# Patient Record
Sex: Female | Born: 1990 | Race: White | Hispanic: No | Marital: Single | Smoking: Former smoker
Health system: Southern US, Community
[De-identification: ages and names within clinical notes are randomized; demographics above are authoritative.]

## PROBLEM LIST (undated history)

## (undated) ENCOUNTER — Inpatient Hospital Stay (HOSPITAL_COMMUNITY): Payer: Self-pay

## (undated) DIAGNOSIS — F419 Anxiety disorder, unspecified: Secondary | ICD-10-CM

## (undated) DIAGNOSIS — O34219 Maternal care for unspecified type scar from previous cesarean delivery: Secondary | ICD-10-CM

## (undated) DIAGNOSIS — R51 Headache: Secondary | ICD-10-CM

## (undated) DIAGNOSIS — G8929 Other chronic pain: Secondary | ICD-10-CM

## (undated) DIAGNOSIS — F32A Depression, unspecified: Secondary | ICD-10-CM

## (undated) DIAGNOSIS — Z87442 Personal history of urinary calculi: Secondary | ICD-10-CM

## (undated) DIAGNOSIS — D649 Anemia, unspecified: Secondary | ICD-10-CM

## (undated) DIAGNOSIS — R519 Headache, unspecified: Secondary | ICD-10-CM

## (undated) DIAGNOSIS — J302 Other seasonal allergic rhinitis: Secondary | ICD-10-CM

## (undated) DIAGNOSIS — F900 Attention-deficit hyperactivity disorder, predominantly inattentive type: Secondary | ICD-10-CM

## (undated) DIAGNOSIS — K219 Gastro-esophageal reflux disease without esophagitis: Secondary | ICD-10-CM

## (undated) DIAGNOSIS — F329 Major depressive disorder, single episode, unspecified: Secondary | ICD-10-CM

## (undated) HISTORY — DX: Attention-deficit hyperactivity disorder, predominantly inattentive type: F90.0

## (undated) HISTORY — DX: Anemia, unspecified: D64.9

## (undated) HISTORY — DX: Major depressive disorder, single episode, unspecified: F32.9

## (undated) HISTORY — DX: Other seasonal allergic rhinitis: J30.2

## (undated) HISTORY — DX: Headache, unspecified: R51.9

## (undated) HISTORY — DX: Other chronic pain: G89.29

## (undated) HISTORY — PX: TONSILLECTOMY: SUR1361

## (undated) HISTORY — DX: Gastro-esophageal reflux disease without esophagitis: K21.9

## (undated) HISTORY — DX: Headache: R51

## (undated) HISTORY — DX: Personal history of urinary calculi: Z87.442

## (undated) HISTORY — DX: Depression, unspecified: F32.A

---
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
## 2008-02-11 ENCOUNTER — Ambulatory Visit: Payer: Self-pay | Admitting: Family Medicine

## 2008-02-11 DIAGNOSIS — H109 Unspecified conjunctivitis: Secondary | ICD-10-CM | POA: Insufficient documentation

## 2008-05-01 ENCOUNTER — Telehealth: Payer: Self-pay | Admitting: Family Medicine

## 2008-07-21 ENCOUNTER — Ambulatory Visit: Payer: Self-pay | Admitting: Family Medicine

## 2008-07-21 DIAGNOSIS — J1189 Influenza due to unidentified influenza virus with other manifestations: Secondary | ICD-10-CM | POA: Insufficient documentation

## 2008-10-23 ENCOUNTER — Ambulatory Visit (HOSPITAL_COMMUNITY): Admission: RE | Admit: 2008-10-23 | Discharge: 2008-10-23 | Payer: Self-pay | Admitting: Obstetrics and Gynecology

## 2009-01-21 ENCOUNTER — Observation Stay (HOSPITAL_COMMUNITY): Admission: AD | Admit: 2009-01-21 | Discharge: 2009-01-22 | Payer: Self-pay | Admitting: Obstetrics and Gynecology

## 2009-02-03 ENCOUNTER — Inpatient Hospital Stay (HOSPITAL_COMMUNITY): Admission: AD | Admit: 2009-02-03 | Discharge: 2009-02-03 | Payer: Self-pay | Admitting: Obstetrics and Gynecology

## 2009-02-09 ENCOUNTER — Inpatient Hospital Stay (HOSPITAL_COMMUNITY): Admission: AD | Admit: 2009-02-09 | Discharge: 2009-02-12 | Payer: Self-pay | Admitting: Obstetrics and Gynecology

## 2009-02-15 ENCOUNTER — Inpatient Hospital Stay (HOSPITAL_COMMUNITY): Admission: AD | Admit: 2009-02-15 | Discharge: 2009-02-19 | Payer: Self-pay | Admitting: Obstetrics and Gynecology

## 2009-02-15 ENCOUNTER — Encounter: Payer: Self-pay | Admitting: Obstetrics and Gynecology

## 2009-02-20 ENCOUNTER — Inpatient Hospital Stay (HOSPITAL_COMMUNITY): Admission: AD | Admit: 2009-02-20 | Discharge: 2009-02-21 | Payer: Self-pay | Admitting: Obstetrics and Gynecology

## 2009-05-29 ENCOUNTER — Emergency Department (HOSPITAL_COMMUNITY): Admission: EM | Admit: 2009-05-29 | Discharge: 2009-05-29 | Payer: Self-pay | Admitting: Emergency Medicine

## 2009-09-08 ENCOUNTER — Emergency Department (HOSPITAL_COMMUNITY): Admission: EM | Admit: 2009-09-08 | Discharge: 2009-09-08 | Payer: Self-pay | Admitting: Emergency Medicine

## 2010-08-06 ENCOUNTER — Emergency Department (HOSPITAL_COMMUNITY): Admission: EM | Admit: 2010-08-06 | Discharge: 2010-08-06 | Payer: Self-pay | Admitting: Family Medicine

## 2010-08-06 LAB — ABO/RH: RH Type: POSITIVE

## 2010-08-06 LAB — RPR
RPR: BORDERLINE
RPR: NONREACTIVE

## 2010-09-12 HISTORY — DX: Maternal care for unspecified type scar from previous cesarean delivery: O34.219

## 2010-09-12 NOTE — L&D Delivery Note (Signed)
Delivery Note At 9:45 PM a viable and healthy female was delivered via Vaginal, Spontaneous Delivery (Presentation: Left Occiput Anterior).  APGAR: 9, 9; weight 7 lb 1.1 oz (3205 g).   Placenta status: Intact, Spontaneous.  Cord: 3 vessels with the following complications: None.   Anesthesia: Epidural  Episiotomy: None Lacerations: Bilateral labial lacerations noted repaired with both 3-0 chromic and 3-0 vicryl with locking and interrupted stitches.  Hemostasis assured.  No midline lacerations.   Suture Repair: 3.0 chromic Est. Blood Loss (mL): 350  Mom to postpartum.  Baby to nursery-stable.  Malcolm Metro 04/01/2011, 10:16 PM

## 2010-10-04 ENCOUNTER — Encounter: Payer: Self-pay | Admitting: Obstetrics and Gynecology

## 2010-11-05 ENCOUNTER — Inpatient Hospital Stay (HOSPITAL_COMMUNITY)
Admission: AD | Admit: 2010-11-05 | Discharge: 2010-11-06 | Disposition: A | Discharge status: Home / Self Care | Payer: Medicaid Other | Source: Ambulatory Visit | Attending: Obstetrics and Gynecology | Admitting: Obstetrics and Gynecology

## 2010-11-05 DIAGNOSIS — O210 Mild hyperemesis gravidarum: Secondary | ICD-10-CM | POA: Insufficient documentation

## 2010-11-05 LAB — URINALYSIS, ROUTINE W REFLEX MICROSCOPIC
Hgb urine dipstick: NEGATIVE
Nitrite: NEGATIVE
Specific Gravity, Urine: 1.015 (ref 1.005–1.030)
Urobilinogen, UA: 0.2 mg/dL (ref 0.0–1.0)

## 2010-11-05 LAB — BASIC METABOLIC PANEL
CO2: 24 mEq/L (ref 19–32)
Calcium: 9 mg/dL (ref 8.4–10.5)
GFR calc Af Amer: >60 mL/min (ref >60)
GFR calc non Af Amer: >60 mL/min (ref >60)
Sodium: 136 mEq/L (ref 135–145)

## 2010-11-23 LAB — POCT PREGNANCY, URINE: Preg Test, Ur: POSITIVE

## 2010-12-20 LAB — URINALYSIS, ROUTINE W REFLEX MICROSCOPIC
Ketones, ur: 40 mg/dL — AB
Nitrite: NEGATIVE
Protein, ur: NEGATIVE mg/dL
Specific Gravity, Urine: 1.015 (ref 1.005–1.030)
Urobilinogen, UA: 2 mg/dL — ABNORMAL HIGH (ref 0.0–1.0)

## 2010-12-20 LAB — DIFFERENTIAL
Basophils Absolute: 0 10*3/uL (ref 0.0–0.1)
Basophils Absolute: 0 10*3/uL (ref 0.0–0.1)
Basophils Relative: 0 % (ref 0–1)
Basophils Relative: 0 % (ref 0–1)
Eosinophils Absolute: 0 10*3/uL (ref 0.0–0.7)
Eosinophils Absolute: 0.1 10*3/uL (ref 0.0–0.7)
Eosinophils Relative: 0 % (ref 0–5)
Lymphocytes Relative: 12 % (ref 12–46)
Lymphs Abs: 1.4 10*3/uL (ref 0.7–4.0)
Monocytes Absolute: 0.5 10*3/uL (ref 0.1–1.0)
Monocytes Absolute: 0.9 10*3/uL (ref 0.1–1.0)
Monocytes Relative: 5 % (ref 3–12)
Monocytes Relative: 7 % (ref 3–12)
Neutro Abs: 10.7 10*3/uL — ABNORMAL HIGH (ref 1.7–7.7)
Neutro Abs: 9.3 10*3/uL — ABNORMAL HIGH (ref 1.7–7.7)
Neutrophils Relative %: 83 % — ABNORMAL HIGH (ref 43–77)

## 2010-12-20 LAB — CBC
HCT: 30.4 % — ABNORMAL LOW (ref 36.0–46.0)
HCT: 31.7 % — ABNORMAL LOW (ref 36.0–46.0)
HCT: 35.6 % — ABNORMAL LOW (ref 36.0–46.0)
Hemoglobin: 10.9 g/dL — ABNORMAL LOW (ref 12.0–15.0)
Hemoglobin: 11.5 g/dL — ABNORMAL LOW (ref 12.0–15.0)
MCHC: 34.6 g/dL (ref 30.0–36.0)
MCHC: 36 g/dL (ref 30.0–36.0)
MCHC: 36.4 g/dL — ABNORMAL HIGH (ref 30.0–36.0)
MCV: 94.5 fL (ref 78.0–100.0)
MCV: 94.8 fL (ref 78.0–100.0)
MCV: 94.9 fL (ref 78.0–100.0)
Platelets: 269 10*3/uL (ref 150–400)
RBC: 3.2 MIL/uL — ABNORMAL LOW (ref 3.87–5.11)
RBC: 3.35 MIL/uL — ABNORMAL LOW (ref 3.87–5.11)
RBC: 3.75 MIL/uL — ABNORMAL LOW (ref 3.87–5.11)
RDW: 11.9 % (ref 11.5–15.5)
RDW: 12.1 % (ref 11.5–15.5)
RDW: 12.6 % (ref 11.5–15.5)
WBC: 11.3 10*3/uL — ABNORMAL HIGH (ref 4.0–10.5)
WBC: 13.8 10*3/uL — ABNORMAL HIGH (ref 4.0–10.5)

## 2010-12-20 LAB — URINE CULTURE
Colony Count: NO GROWTH
Culture: NO GROWTH

## 2010-12-20 LAB — URINE MICROSCOPIC-ADD ON

## 2010-12-21 LAB — URINALYSIS, ROUTINE W REFLEX MICROSCOPIC
Bilirubin Urine: NEGATIVE
Glucose, UA: NEGATIVE mg/dL
Ketones, ur: NEGATIVE mg/dL
Ketones, ur: 15 mg/dL — AB
Leukocytes, UA: NEGATIVE
Nitrite: NEGATIVE
Nitrite: NEGATIVE
Protein, ur: NEGATIVE mg/dL
Urobilinogen, UA: 0.2 mg/dL (ref 0.0–1.0)
pH: 6 (ref 5.0–8.0)
pH: 6.5 (ref 5.0–8.0)

## 2010-12-21 LAB — URINE CULTURE

## 2010-12-21 LAB — URINALYSIS, MICROSCOPIC ONLY
Bilirubin Urine: NEGATIVE
Glucose, UA: NEGATIVE mg/dL
Hgb urine dipstick: NEGATIVE
Specific Gravity, Urine: <1.005 — ABNORMAL LOW (ref 1.005–1.030)
pH: 7 (ref 5.0–8.0)

## 2010-12-26 ENCOUNTER — Inpatient Hospital Stay (HOSPITAL_COMMUNITY)
Admission: AD | Admit: 2010-12-26 | Discharge: 2010-12-26 | Disposition: A | Discharge status: Home / Self Care | Payer: Medicaid Other | Source: Ambulatory Visit | Attending: Obstetrics and Gynecology | Admitting: Obstetrics and Gynecology

## 2010-12-26 DIAGNOSIS — O9989 Other specified diseases and conditions complicating pregnancy, childbirth and the puerperium: Secondary | ICD-10-CM

## 2010-12-26 DIAGNOSIS — O212 Late vomiting of pregnancy: Secondary | ICD-10-CM | POA: Insufficient documentation

## 2010-12-26 DIAGNOSIS — O99891 Other specified diseases and conditions complicating pregnancy: Secondary | ICD-10-CM

## 2010-12-26 DIAGNOSIS — B9789 Other viral agents as the cause of diseases classified elsewhere: Secondary | ICD-10-CM | POA: Insufficient documentation

## 2010-12-26 LAB — DIFFERENTIAL
Eosinophils Relative: 0 % (ref 0–5)
Lymphocytes Relative: 7 % — ABNORMAL LOW (ref 12–46)
Lymphs Abs: 1 10*3/uL (ref 0.7–4.0)
Monocytes Relative: 5 % (ref 3–12)

## 2010-12-26 LAB — CBC
HCT: 36.7 % (ref 36.0–46.0)
MCH: 30.8 pg (ref 26.0–34.0)
MCV: 92 fL (ref 78.0–100.0)
RBC: 3.99 MIL/uL (ref 3.87–5.11)
RDW: 12.7 % (ref 11.5–15.5)
WBC: 13.9 10*3/uL — ABNORMAL HIGH (ref 4.0–10.5)

## 2010-12-26 LAB — URINALYSIS, ROUTINE W REFLEX MICROSCOPIC
Bilirubin Urine: NEGATIVE
Ketones, ur: 15 mg/dL — AB
Nitrite: NEGATIVE
Protein, ur: NEGATIVE mg/dL
pH: 6 (ref 5.0–8.0)

## 2010-12-26 LAB — URINE MICROSCOPIC-ADD ON

## 2010-12-28 LAB — URINE CULTURE: Culture  Setup Time: 201204160000

## 2011-01-25 NOTE — Discharge Summary (Signed)
NAMEJAIA, ALONGE                  ACCOUNT NO.:  [id number]   MEDICAL RECORD NO.:  [id number]         PATIENT TYPE:  WINP   LOCATION:                                FACILITY[facility]  [facility]   PHYSICIAN:  Arlyce Harman, MD     DATE OF BIRTH:  23-Jan-1991   DATE OF ADMISSION:  02/09/2009  DATE OF DISCHARGE:                               DISCHARGE SUMMARY   Ms. Mogg was admitted at 33-1/[redacted] weeks gestation on Feb 09, 2009, for  observation due to complaints of contractions and possible preterm  labor.  She was given Iv hydration and oral tocolytics. Howeverd she  continued to have regular contractions and was admitted on February 10, 2009.  Treatment in the hospital has been IV hydration plus p.o. terbutaline.  She did eventually respond and is currently denying contractions or  leakage of fluids.  The patient is currently 34 weeks and will be  discharged to home on bedrest.   LABORATORY DATA:  WBC is 11,300 and hematocrit is 30.4.   FINAL DIAGNOSIS:  Preterm labor.   SIGNIFICANT FINDINGS:  Labor, resolved.  No cervical change.   Discharge medications:  Prenatal vitamins one daily p. o.    Terbutaline 5 mg 1 p.o. every 6 hours    Ferrolet 90 1 p. o. daily    Colace 50 mg 1 p. o. daily     Procedures performed and treatment rendered is bedrest and IV hydration  and oral tocolytics.   Condition of the patient on discharge is excellent with no further  complaints of contractions.   Instructions given to the patient and family are home bedrest with  bathroom privileges.  No sexual activity.   Diet is regular.   Followup care with Dr. Neva Seat in the office on Monday.      Arlyce Harman, MD  Electronically Signed     EG/MEDQ  D:  02/12/2009  T:  02/13/2009  Job:  [id number]

## 2011-01-25 NOTE — Discharge Summary (Signed)
[name], [name]                  ACCOUNT NO.:  [id number]   MEDICAL RECORD NO.:  [id number]          PATIENT TYPE:  INP   LOCATION:  9320                          [facility]ACILITY:  [facility]   PHYSICIAN:  Arlyce Harman, MD     DATE OF BIRTH:  06-26-1991   DATE OF ADMISSION:  02/15/2009  DATE OF DISCHARGE:  02/19/2009                               DISCHARGE SUMMARY   ADMITTING DIAGNOSIS:  34-1/2-weeks pregnant with prematurely ruptured  membranes and contractions, breech presentation.   REASON FOR HOSPITALIZATION:  The patient is a 20 year old primigravida  who is 34-1/2-weeks pregnant.  She has had multiple admissions for  premature labor and was known to be in breech presentation when she was  discharged a week prior to a admission.  The patient was in her usual  state of health until approximately 30 minutes prior to admission when  she complained of a gush of fluid which she could not stop.  Therefore,  she came to the hospital for evaluation.  Membranes were confirmed to be  ruptured.  [name] was having irregular contractions.  An ultrasound  revealed that the patient was still in a frank breech position.  Because  of prematurity. prematurely ruptured membranes, and breech presentation,  Cesarean section was done.  The infant was a viable female infant with  good Apgars and was taken to the NICU.  Postoperative course for [name]  has been unremarkable.  [name] was discharged on the fourth postop day in  good condition.  She will be followed up in my office in 2 weeks for an  incision check.   DISCHARGE MEDICATIONS:  Percocet 5 mg, 1-2 every 4-6 hours as needed for  pain  Prenatal vitamins 1 a day  Ferralet 90 one a day  Colace 1 tablet a day as needed for constipation.   LABORATORY DATA:  Hemoglobin of 11.5 and hematocrit of 31.7 on February 16, 2009.  Other labs unremarkable.   FINAL DIAGNOSIS:  A 34-1/2-weeks pregnancy with premature rupture of  membranes, breech presentation, who  underwent C-section.   POSTOPERATIVE COURSE:  Unremarkable.  No significant findings.   PROCEDURE PERFORMED:  Primary low transverse Cesarean section.   TREATMENT RENDERED:  Routine postoperative care.   CONDITION AT DISCHARGE:  Excellent.  The patient is having appropriate  incisional tenderness.  No other problem reported.   INSTRUCTIONS GIVEN TO THE PATIENT AND OR FAMILY:  [name] is to come to my  office in 2 weeks for an incision check.  Bed rest for the next 2 weeks.  Call if any fever of 101 or greater.  No coitus until after 6 weeks postpartum.      Arlyce Harman, MD  Electronically Signed     EG/MEDQ  D:  02/19/2009  T:  02/20/2009  Job:  [id number]

## 2011-01-25 NOTE — Discharge Summary (Signed)
NAMEKIMBLY, [name]                  ACCOUNT NO.:  [id number]   MEDICAL RECORD NO.:  [id number]          PATIENT TYPE:  OBV   LOCATION:  9171                          [facility]ACILITY:  [facility]   PHYSICIAN:  Pieter Partridge, MD   DATE OF BIRTH:  [birth year]   DATE OF ADMISSION:  01/21/2009  DATE OF DISCHARGE:  01/22/2009                               DISCHARGE SUMMARY   PREOPERATIVE DIAGNOSIS:  A 20 year old G1 at approximately 31 weeks with  preterm labor.   ADMITTING DIAGNOSIS:  Preterm labor, pregnancy at 31 weeks.   PROCEDURES:  Tocolysis with terbutaline and Procardia and then  betamethasone given.   DISCHARGE DIAGNOSIS:  Preterm labor, pregnancy at 31 weeks.   HISTORY OF PRESENT ILLNESS:  [name] is a 20 year old G1 whose  pregnancy was uncomplicated until she started having contractions on May  11 a.m.  Fetal fibronectin was done in the office, which was positive  and gonorrhea and Chlamydia were also done.  Her cervix at the time for  the fetal fibronectin was 1 cm.  Upon notification of a positive result,  the patient was called to see if she was having any issues and the  patient reported that she was having contractions every 4 to 8 minutes.  She was then sent to Triage.  We were able to confirm that her cervix  was still 1 cm and 50% effaced but she was contracting every 4 to 8  minutes and in significant stress.  She had no vaginal bleeding.  No  leakage of fluid.   The patient was admitted for IV hydration and betamethasone  administration and ultrasound to determine estimated fetal weight, if  any issues then treat.  She was also placed on bed rest and given  tocolytics.   The patient received terbutaline and IV fluids, which seems to help with  the contraction.  She was then placed on p.o. Procardia x1 dose, the  second dose was held due to a low blood pressure of, I believe,  approximately 80/50.  She was then given terbutaline and her  contractions were minimal  while in the hospital.  She had an ultrasound,  which showed a breech baby and approximately 3 pounds and 4 ounces.   LABORATORY DATA:  Urinalysis with ketones of 15+.  Wet prep was  negative.  Again, gonorrhea and Chlamydia was negative.  Trace blood on  the admission.  Urine culture was ordered prior to discharge.  Continuous fetal monitoring was reassuring.  Baby was reactive.   The patient was admitted overnight and in the morning her cervix was  unchanged.  She waited until the second dose of betamethasone prior to  discharge.  She was given preterm labor precautions.  She is a smoker  who has quit during this pregnancy and was continued to encourage that.  Bed rest precautions and preterm labor precautions were given.   FOLLOWUP:  The patient is to come back in 1 week, pelvic rest.   DIET:  Regular.   MEDICATIONS:  Terbutaline 5 mg 1 tablet p.o. q.6 h.  We will follow up on urine culture.      Pieter Partridge, MD  Electronically Signed     EBV/MEDQ  D:  02/04/2009  T:  02/05/2009  Job:  [phone] [phone]

## 2011-01-25 NOTE — Op Note (Signed)
[name], [name]                  ACCOUNT NO.:  [id number]   MEDICAL RECORD NO.:  [id number]          PATIENT TYPE:  INP   LOCATION:  9320                          [facility]ILITY:  [facility]   PHYSICIAN:  Arlyce Harman, MD     DATE OF BIRTH:  1991-07-06   DATE OF PROCEDURE:  02/15/2009  DATE OF DISCHARGE:                               OPERATIVE REPORT   PREOPERATIVE DIAGNOSES:  1. A 34-1/2 weeks' gestation with prematurely ruptured membranes.  2. Homero Fellers breech presentation.   POSTOPERATIVE DIAGNOSES:  1. A 34-1/2 weeks' gestation with prematurely ruptured membranes.  2. Homero Fellers breech presentation.   PROCEDURE:  Primary low transverse cervical cesarean section.   SURGEON:  Arlyce Harman, MD.   ASSISTANT:  Pieter Partridge, MD.   FINDINGS:  Healthy female infant with good Apgars.  DESCRIPTION OF TECHNICAL PROCEDURES USED: None   INDICATIONS AND CONSENT:  The patient is a 20 year old gravida 1, para 0  who is 34-1/2 weeks.  She has had multiple episodes of preterm labor.  She was administed steroids  approxomately 3 weeks prior to admission.  She was recently admitted on last week and was discharged home on bed  res. Today she called to state that her membranes ruptured around 4:30  PM.  The fluid was clear.  [name] came to [facility] and  was  admitted upon confirmation of rupture of membranes.  Ultrasound  confirmed persistent breech presentation.  Options for therapy were  discussed with Mayzie, her spouse and her mother.  Informed consent was  given for cesarean section.   PROCEDURE IN DETAIL:  The patient was placed on the table in a supine  position with wedging after spinal anesthesia had been given and  retested to ensure that there was an adequate level of anesthesia.  The  abdomen had been sterilely prepped and draped in the usual fashion.  After an adequate level of anesthesia was obtained, the skin was entered  in a Pfannenstiel fashion, and the incision was extended  through the  abdominal layers without difficulty.  The uterus was entered in the low  transverse cervical fashion.  Fluid was clear when the membranes were  ruptured.  The breech was delivered.  It was a frank breech.  The lower  torso and lower extremities were delivered followed by the upper torso  and upper extremities and the infant head was delivered using the  Mauriceau technique.  The infant's nose and mouth were suctioned and the  cord was clamped and cut.  The infant was handed off to the NICU team.  The  placenta was then manually removed.  The uterus was sponged out and  the uterus was closed using 0 Vicryl in a running locked fashion.  The  second layer was closed with a modified Lambert stitch.  Hemostasis was  excellent.  The abdomen was irrigated and suctioned out.  The peritoneum  was closed with 3-0 Vicryl in a running fashion.  The fascia was closed  with 0 Vicryl in a running locked fashion.  The subcutaneous layer was  closed with  2-0 plain suture.  The skin was approximated with 4-0 Vicryl  using a Mellody Dance needle.  At this point, the procedure was terminated, and  the patient was awakened and taken to recovery in good condition.   DESCRIPTION OF SPECIMEN REMOVED:  Placenta and membranes grossly appeared unremarkable.  Sent to pathology  to rule out amnionitis.   DISPOSITION OF SPECIMENTS REMOVED:  The placenta was sent to Pathology.      Arlyce Harman, MD  Electronically Signed     EG/MEDQ  D:  02/16/2009  T:  02/16/2009  Job:  [id number]

## 2011-01-25 NOTE — Assessment & Plan Note (Signed)
[facility]                                 ON-CALL NOTE   [name], [name]                           MRN:          [id number]  DATE:03/18/2007                            DOB:          12/01/1990    CALLER:  Deno Lunger.   TELEPHONE NUMBER:  [phone].   PATIENT DOCTOR:  Clent Ridges.   Mother is calling because child has a fever.  She was seen by me on  Thursday with a viral type syndrome for 48 hours.  Examination at that  time was negative.  A review of systems was negative.  She had no  history of tick bite, etc.  Her mother is calling today because she is  still running a fever, does not feel good.  She wants some diagnostic  studies done today as opposed to tomorrow in the office.  Advised take  her to [facility] now for evaluate.     Jeffrey A. Tawanna Cooler, MD  Electronically Signed    JAT/MedQ  DD: 03/18/2007  DT: 03/18/2007  Job #: [id number]
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
## 2011-03-01 ENCOUNTER — Inpatient Hospital Stay (HOSPITAL_COMMUNITY)
Admission: AD | Admit: 2011-03-01 | Discharge: 2011-03-01 | Disposition: A | Discharge status: Home / Self Care | Payer: Medicaid Other | Source: Ambulatory Visit | Attending: Obstetrics and Gynecology | Admitting: Obstetrics and Gynecology

## 2011-03-01 DIAGNOSIS — O212 Late vomiting of pregnancy: Secondary | ICD-10-CM

## 2011-03-01 DIAGNOSIS — O4700 False labor before 37 completed weeks of gestation, unspecified trimester: Secondary | ICD-10-CM | POA: Insufficient documentation

## 2011-03-01 LAB — URINALYSIS, ROUTINE W REFLEX MICROSCOPIC
Ketones, ur: >80 mg/dL — AB
Nitrite: NEGATIVE
Protein, ur: NEGATIVE mg/dL

## 2011-03-01 LAB — CBC
HCT: 35.8 % — ABNORMAL LOW (ref 36.0–46.0)
Hemoglobin: 12.4 g/dL (ref 12.0–15.0)
MCH: 31.8 pg (ref 26.0–34.0)
MCHC: 34.6 g/dL (ref 30.0–36.0)
MCV: 91.8 fL (ref 78.0–100.0)

## 2011-03-01 LAB — COMPREHENSIVE METABOLIC PANEL
Alkaline Phosphatase: 101 U/L (ref 39–117)
BUN: 6 mg/dL (ref 6–23)
Calcium: 9.2 mg/dL (ref 8.4–10.5)
Creatinine, Ser: 0.47 mg/dL — ABNORMAL LOW (ref 0.50–1.10)
GFR calc Af Amer: >60 mL/min (ref >60)
Glucose, Bld: 91 mg/dL (ref 70–99)
Total Protein: 6.7 g/dL (ref 6.0–8.3)

## 2011-03-02 LAB — URINE CULTURE
Colony Count: 2000
Culture  Setup Time: 201206191343

## 2011-03-12 ENCOUNTER — Inpatient Hospital Stay (HOSPITAL_COMMUNITY)
Admission: AD | Admit: 2011-03-12 | Discharge: 2011-03-12 | Disposition: A | Discharge status: Home / Self Care | Payer: Medicaid Other | Source: Ambulatory Visit | Attending: Obstetrics and Gynecology | Admitting: Obstetrics and Gynecology

## 2011-03-12 DIAGNOSIS — O9989 Other specified diseases and conditions complicating pregnancy, childbirth and the puerperium: Secondary | ICD-10-CM

## 2011-03-12 DIAGNOSIS — O99891 Other specified diseases and conditions complicating pregnancy: Secondary | ICD-10-CM | POA: Insufficient documentation

## 2011-03-19 ENCOUNTER — Observation Stay (HOSPITAL_COMMUNITY)
Admission: AD | Admit: 2011-03-19 | Discharge: 2011-03-20 | Disposition: A | Discharge status: Home / Self Care | Payer: Medicaid Other | Source: Ambulatory Visit | Attending: Obstetrics and Gynecology | Admitting: Obstetrics and Gynecology

## 2011-03-19 ENCOUNTER — Observation Stay (HOSPITAL_COMMUNITY)
Admission: AD | Admit: 2011-03-19 | Payer: Medicaid Other | Source: Ambulatory Visit | Admitting: Obstetrics and Gynecology

## 2011-03-19 ENCOUNTER — Encounter (HOSPITAL_COMMUNITY): Payer: Self-pay | Admitting: *Deleted

## 2011-03-19 DIAGNOSIS — O4700 False labor before 37 completed weeks of gestation, unspecified trimester: Principal | ICD-10-CM | POA: Insufficient documentation

## 2011-03-19 HISTORY — DX: Anxiety disorder, unspecified: F41.9

## 2011-03-19 NOTE — Progress Notes (Signed)
Pt states, " I woke up at 1100 pm and I was having tightening and pressure in my low abd."

## 2011-03-20 ENCOUNTER — Encounter (HOSPITAL_COMMUNITY): Payer: Self-pay | Admitting: *Deleted

## 2011-03-20 LAB — URINE MICROSCOPIC-ADD ON

## 2011-03-20 LAB — URINALYSIS, ROUTINE W REFLEX MICROSCOPIC
Glucose, UA: NEGATIVE mg/dL
Protein, ur: NEGATIVE mg/dL

## 2011-03-20 LAB — CBC
Hemoglobin: 12.4 g/dL (ref 12.0–15.0)
MCH: 31.3 pg (ref 26.0–34.0)
MCHC: 34.2 g/dL (ref 30.0–36.0)

## 2011-03-20 LAB — RPR: RPR Ser Ql: NONREACTIVE

## 2011-03-20 MED ORDER — RHO D IMMUNE GLOBULIN 1500 UNIT/2ML IJ SOLN: 300.0000 ug | Freq: Once | INTRAMUSCULAR | Status: DC

## 2011-03-20 MED ORDER — PRENATAL PLUS 27-1 MG PO TABS
1.0000 | ORAL_TABLET | Freq: Every day | ORAL | Status: DC
  Administered 2011-03-20: 1 via ORAL
  Filled 2011-03-20 (×2): qty 1

## 2011-03-20 MED ORDER — ACETAMINOPHEN 325 MG PO TABS: 650.0000 mg | ORAL_TABLET | ORAL | Status: DC | PRN

## 2011-03-20 MED ORDER — NIFEDIPINE 10 MG PO CAPS
20.0000 mg | ORAL_CAPSULE | Freq: Once | ORAL | Status: AC
  Administered 2011-03-20: 20 mg via ORAL

## 2011-03-20 MED ORDER — NIFEDIPINE 10 MG PO CAPS
10.0000 mg | ORAL_CAPSULE | Freq: Four times a day (QID) | ORAL | Status: DC
  Administered 2011-03-20: 10 mg via ORAL
  Filled 2011-03-20: qty 1

## 2011-03-20 MED ORDER — ZOLPIDEM TARTRATE 10 MG PO TABS
10.0000 mg | ORAL_TABLET | Freq: Every evening | ORAL | Status: DC | PRN
  Administered 2011-03-20: 10 mg via ORAL
  Filled 2011-03-20: qty 1

## 2011-03-20 MED ORDER — LACTATED RINGERS IV SOLN
INTRAVENOUS | Status: DC
  Administered 2011-03-20 (×2): via INTRAVENOUS
  Filled 2011-03-20: qty 1000

## 2011-03-20 MED ORDER — CALCIUM CARBONATE ANTACID 500 MG PO CHEW
2.0000 | CHEWABLE_TABLET | ORAL | Status: DC | PRN
  Filled 2011-03-20: qty 2

## 2011-03-20 MED ORDER — NIFEDIPINE 10 MG PO CAPS
ORAL_CAPSULE | ORAL | Status: AC
  Filled 2011-03-20: qty 2

## 2011-03-20 MED ORDER — DOCUSATE SODIUM 100 MG PO CAPS
100.0000 mg | ORAL_CAPSULE | Freq: Every day | ORAL | Status: DC
  Administered 2011-03-20: 100 mg via ORAL
  Filled 2011-03-20 (×2): qty 1

## 2011-03-20 NOTE — H&P (Signed)
[name] is a 20 y.o. female presenting for painful contractions. She has a history of a prior cesarean section. Maternal Medical History:  Reason for admission: Reason for admission: contractions.  Contractions: Onset was 6-12 hours ago.   Frequency: irregular.    Fetal activity: Perceived fetal activity is normal.   Last perceived fetal movement was within the past hour.      OB History    Grav Para Term Preterm Abortions TAB SAB Ect Mult Living   2 1  1      1      Past Medical History  Diagnosis Date  . No pertinent past medical history   . Anxiety    Past Surgical History  Procedure Date  . Cesarean section   . Tonsillectomy   . No past surgeries    Family History: family history includes Cancer in her mother and Hypertension in her maternal grandfather and mother. Social History:  reports that she has quit smoking. Her smoking use included Cigarettes. She has a 1 pack-year smoking history. She has never used smokeless tobacco. She reports that she does not drink alcohol or use illicit drugs.  Review of Systems  Constitutional: Negative.   HENT: Negative.   Eyes: Negative.   Respiratory: Negative.   Cardiovascular: Negative.   Gastrointestinal: Negative.   Genitourinary: Negative.   Musculoskeletal: Negative.   Skin: Negative.   Neurological: Negative.   Endo/Heme/Allergies: Negative.   Psychiatric/Behavioral: Negative.     Dilation: 1 Effacement (%): 90 Station: -2 Exam by::  (M.Topp,RN) Blood pressure 89/47, pulse 66, temperature 97.8 F (36.6 C), temperature source Oral, resp. rate 18, height 5\' 5"  (1.651 m), weight 66.225 kg (146 lb). Exam Physical Exam  Constitutional: She is oriented to person, place, and time. She appears well-developed.  Neck: Normal range of motion.  Cardiovascular: Normal rate.   Respiratory: Effort normal and breath sounds normal.  GI: Soft. Bowel sounds are normal.  Genitourinary:       On exam, 1 cm dilated. Unchanged  after two hours  Musculoskeletal: Normal range of motion.  Neurological: She is alert and oriented to person, place, and time.  Skin: Skin is warm.  Psychiatric: She has a normal mood and affect.    Prenatal labs: Antibody: Negative (11/25 0000)  RPR: Borderline (11/25 0000)  HBsAg: Negative (11/25 0000)  HIV: Non-reactive (11/25 0000)  GBS: unknown   Assessment/Plan: 35 week IUP with history of previous cesarean section with preterm contractions. Fetal heart rate reassuring. Admit for observation  Procardia for tocolysis for maternal comfort. Will dc home if stable  [name] 03/20/2011, 8:27 AM

## 2011-03-20 NOTE — ED Notes (Signed)
To continue to observe pt and recheck in another hour-pt relaxed with u/c's

## 2011-03-20 NOTE — Discharge Summary (Signed)
Pt verbalized understanding to her d/c instructions, will follow up with primary md this week and continue procardia q6hrs.

## 2011-03-20 NOTE — Progress Notes (Signed)
Contacted Dr. Gevena Cotton and discussed pt. Dr. Gevena Cotton wishes to have pt orally hydrated and then recheck cervix in 2 hrs.

## 2011-03-20 NOTE — Treatment Plan (Signed)
Report called to Renee,RN on antenatatl for transfer of care

## 2011-03-20 NOTE — ED Provider Notes (Signed)
History   Pt presents today c/o ctx that began around 11pm tonight. She denies vag dc or bleeding. She reports GFM.  Chief Complaint  Patient presents with  . Contractions   HPI  OB History    Grav Para Term Preterm Abortions TAB SAB Ect Mult Living   2 1  1      1       Past Medical History  Diagnosis Date  . No pertinent past medical history   . Anxiety     Past Surgical History  Procedure Date  . Cesarean section   . Tonsillectomy     Family History  Problem Relation Age of Onset  . Hypertension Mother   . Cancer Mother   . Hypertension Maternal Grandfather     History  Substance Use Topics  . Smoking status: Never Smoker   . Smokeless tobacco: Never Used  . Alcohol Use: No    Allergies: Allergies no known allergies  Prescriptions prior to admission  Medication Sig Dispense Refill  . prenatal vitamin w/FE, FA (PRENATAL 1 + 1) 27-1 MG TABS Take 1 tablet by mouth daily.          Review of Systems  Constitutional: Negative for fever and chills.  Cardiovascular: Negative for chest pain.  Genitourinary: Negative for dysuria, urgency and frequency.  Neurological: Negative for headaches.  Psychiatric/Behavioral: Negative for depression and suicidal ideas.   Physical Exam   Blood pressure 103/56, pulse 58, temperature 98.4 F (36.9 C), temperature source Oral, resp. rate 20, height 5\' 5"  (1.651 m), weight 146 lb (66.225 kg).  Physical Exam  Constitutional: She appears well-developed and well-nourished. No distress.  HENT:  Head: Normocephalic.  GI: She exhibits no distension. There is no tenderness. There is no rebound and no guarding.  Genitourinary: No vaginal discharge found.       Cervix is 1/90/-2. Fetus is vertex.  Skin: She is not diaphoretic.    MAU Course  Procedures   Pt has continued to have ctx every 2-39min. Discussed with Dr. Gevena Cotton. Will admit for observation.  Henrietta Hoover, [place] 03/20/11 [phone]

## 2011-03-21 LAB — STREP B DNA PROBE: GBS: NEGATIVE

## 2011-03-25 NOTE — Discharge Summary (Signed)
Patient admitted at [redacted] weeks gestation with painful contractions. She had previously been on procardia for preterm contractions. Upon admission, she was hydrated and received procardia. Her contractions resolved. She was discharged home several hours later with no uterine activity. She was told to follow up with Dr. Neva Seat at [facility]. Report any further contractions.

## 2011-03-27 ENCOUNTER — Inpatient Hospital Stay (HOSPITAL_COMMUNITY)
Admission: AD | Admit: 2011-03-27 | Discharge: 2011-03-27 | Disposition: A | Discharge status: Home / Self Care | Payer: Medicaid Other | Source: Ambulatory Visit | Attending: Obstetrics and Gynecology | Admitting: Obstetrics and Gynecology

## 2011-03-27 DIAGNOSIS — O4700 False labor before 37 completed weeks of gestation, unspecified trimester: Secondary | ICD-10-CM | POA: Insufficient documentation

## 2011-03-27 LAB — URINE MICROSCOPIC-ADD ON

## 2011-03-27 LAB — WET PREP, GENITAL

## 2011-03-27 LAB — URINALYSIS, ROUTINE W REFLEX MICROSCOPIC
Bilirubin Urine: NEGATIVE
Nitrite: NEGATIVE
Specific Gravity, Urine: 1.02 (ref 1.005–1.030)
pH: 7 (ref 5.0–8.0)

## 2011-03-27 MED ORDER — CEFIXIME 400 MG PO TABS
400.0000 mg | ORAL_TABLET | Freq: Once | ORAL | Status: AC
  Administered 2011-03-27: 400 mg via ORAL
  Filled 2011-03-27: qty 1

## 2011-03-27 MED ORDER — MORPHINE SULFATE 10 MG/ML IJ SOLN: 5.0000 mg | Freq: Once | INTRAMUSCULAR | Status: DC | PRN

## 2011-03-27 MED ORDER — LACTATED RINGERS IV BOLUS (SEPSIS): 1000.0000 mL | Freq: Once | INTRAVENOUS | Status: DC

## 2011-03-27 NOTE — Progress Notes (Signed)
Dr. Richardson Dopp notified of urine and wet prep results.  Orders received for suprax and dc home if no cervical change.

## 2011-03-27 NOTE — Progress Notes (Signed)
Dr. Richardson Dopp notified of VE and ctx pattern.  Notified of reactive fetal strip.  Orders received.

## 2011-03-27 NOTE — Progress Notes (Signed)
States she came in for contractions, but denies pain....states they are just tight.Marland Kitchen..36wks preg...g2p1

## 2011-03-27 NOTE — ED Notes (Signed)
Prescription for suprax called in to [facility] on [address].  Instructed pt to pick up prescription tomorrow. Pt verbalized understanding.

## 2011-03-27 NOTE — Progress Notes (Signed)
Pt presents to [facility] for ctx that started around 445pm  States ctx are 3-5 mins apart.  States she knows ctx are there but denies pain.

## 2011-03-27 NOTE — ED Notes (Signed)
N. Bascom Levels, CNM at bedside.  MSE done.  Strip reviewed and poc discussed with pt.

## 2011-04-01 ENCOUNTER — Encounter (HOSPITAL_COMMUNITY): Payer: Self-pay | Admitting: Anesthesiology

## 2011-04-01 ENCOUNTER — Inpatient Hospital Stay (HOSPITAL_COMMUNITY): Payer: Medicaid Other | Admitting: Anesthesiology

## 2011-04-01 ENCOUNTER — Encounter (HOSPITAL_COMMUNITY): Payer: Self-pay | Admitting: *Deleted

## 2011-04-01 ENCOUNTER — Inpatient Hospital Stay (HOSPITAL_COMMUNITY)
Admission: AD | Admit: 2011-04-01 | Discharge: 2011-04-03 | DRG: 775 | Disposition: A | Discharge status: Home / Self Care | Payer: Medicaid Other | Source: Ambulatory Visit | Attending: Obstetrics and Gynecology | Admitting: Obstetrics and Gynecology

## 2011-04-01 DIAGNOSIS — O321XX0 Maternal care for breech presentation, not applicable or unspecified: Secondary | ICD-10-CM | POA: Diagnosis present

## 2011-04-01 DIAGNOSIS — IMO0002 Reserved for concepts with insufficient information to code with codable children: Secondary | ICD-10-CM | POA: Diagnosis present

## 2011-04-01 DIAGNOSIS — Z3009 Encounter for other general counseling and advice on contraception: Secondary | ICD-10-CM

## 2011-04-01 DIAGNOSIS — O34219 Maternal care for unspecified type scar from previous cesarean delivery: Secondary | ICD-10-CM | POA: Diagnosis present

## 2011-04-01 LAB — URINE CULTURE: Culture: NO GROWTH

## 2011-04-01 LAB — POCT FERN TEST: Fern Test: POSITIVE

## 2011-04-01 LAB — CBC
Platelets: 283 10*3/uL (ref 150–400)
RDW: 12.7 % (ref 11.5–15.5)
WBC: 18.5 10*3/uL — ABNORMAL HIGH (ref 4.0–10.5)

## 2011-04-01 LAB — ABO/RH: ABO/RH(D): O POS

## 2011-04-01 MED ORDER — EPHEDRINE 5 MG/ML INJ: 10.0000 mg | INTRAVENOUS | Status: DC | PRN

## 2011-04-01 MED ORDER — PHENYLEPHRINE 40 MCG/ML (10ML) SYRINGE FOR IV PUSH (FOR BLOOD PRESSURE SUPPORT): 80.0000 ug | PREFILLED_SYRINGE | INTRAVENOUS | Status: DC | PRN

## 2011-04-01 MED ORDER — LACTATED RINGERS IV SOLN: 500.0000 mL | Freq: Once | INTRAVENOUS | Status: DC

## 2011-04-01 MED ORDER — EPHEDRINE 5 MG/ML INJ
10.0000 mg | INTRAVENOUS | Status: DC | PRN
  Filled 2011-04-01: qty 4

## 2011-04-01 MED ORDER — TERBUTALINE SULFATE 1 MG/ML IJ SOLN: 0.2500 mg | Freq: Once | INTRAMUSCULAR | Status: DC | PRN

## 2011-04-01 MED ORDER — PRENATAL PLUS 27-1 MG PO TABS: 1.0000 | ORAL_TABLET | Freq: Every day | ORAL | Status: DC

## 2011-04-01 MED ORDER — DIPHENHYDRAMINE HCL 50 MG/ML IJ SOLN: 12.5000 mg | INTRAMUSCULAR | Status: DC | PRN

## 2011-04-01 MED ORDER — DIPHENHYDRAMINE HCL 25 MG PO CAPS: 25.0000 mg | ORAL_CAPSULE | Freq: Four times a day (QID) | ORAL | Status: DC | PRN

## 2011-04-01 MED ORDER — ZOLPIDEM TARTRATE 5 MG PO TABS: 5.0000 mg | ORAL_TABLET | Freq: Every evening | ORAL | Status: DC | PRN

## 2011-04-01 MED ORDER — LANOLIN HYDROUS EX OINT: TOPICAL_OINTMENT | CUTANEOUS | Status: DC | PRN

## 2011-04-01 MED ORDER — CITRIC ACID-SODIUM CITRATE 334-500 MG/5ML PO SOLN: 30.0000 mL | ORAL | Status: DC | PRN

## 2011-04-01 MED ORDER — IBUPROFEN 600 MG PO TABS: 600.0000 mg | ORAL_TABLET | Freq: Four times a day (QID) | ORAL | Status: DC | PRN

## 2011-04-01 MED ORDER — ONDANSETRON HCL 4 MG PO TABS: 4.0000 mg | ORAL_TABLET | ORAL | Status: DC | PRN

## 2011-04-01 MED ORDER — FENTANYL 2.5 MCG/ML BUPIVACAINE 1/10 % EPIDURAL INFUSION (WH - ANES)
14.0000 mL/h | INTRAMUSCULAR | Status: DC
  Administered 2011-04-01: 14 mL/h via EPIDURAL
  Filled 2011-04-01: qty 60

## 2011-04-01 MED ORDER — NALBUPHINE SYRINGE 5 MG/0.5 ML: 5.0000 mg | INJECTION | INTRAMUSCULAR | Status: DC | PRN

## 2011-04-01 MED ORDER — LACTATED RINGERS IV SOLN: 500.0000 mL | INTRAVENOUS | Status: DC | PRN

## 2011-04-01 MED ORDER — ONDANSETRON HCL 4 MG/2ML IJ SOLN: 4.0000 mg | Freq: Four times a day (QID) | INTRAMUSCULAR | Status: DC | PRN

## 2011-04-01 MED ORDER — SENNOSIDES-DOCUSATE SODIUM 8.6-50 MG PO TABS: 1.0000 | ORAL_TABLET | Freq: Every day | ORAL | Status: DC

## 2011-04-01 MED ORDER — OXYTOCIN 20 UNITS IN LACTATED RINGERS INFUSION - SIMPLE
1.0000 m[IU]/min | INTRAVENOUS | Status: DC
  Administered 2011-04-01: 1 m[IU]/min via INTRAVENOUS
  Administered 2011-04-01: 333 m[IU]/min via INTRAVENOUS

## 2011-04-01 MED ORDER — ONDANSETRON HCL 4 MG/2ML IJ SOLN: 4.0000 mg | INTRAMUSCULAR | Status: DC | PRN

## 2011-04-01 MED ORDER — OXYTOCIN 20 UNITS IN LACTATED RINGERS INFUSION - SIMPLE
125.0000 mL/h | Freq: Once | INTRAVENOUS | Status: DC
  Administered 2011-04-01: 1 mL/h via INTRAVENOUS
  Filled 2011-04-01: qty 1000

## 2011-04-01 MED ORDER — IBUPROFEN 600 MG PO TABS: 600.0000 mg | ORAL_TABLET | Freq: Four times a day (QID) | ORAL | Status: DC

## 2011-04-01 MED ORDER — SIMETHICONE 80 MG PO CHEW: 80.0000 mg | CHEWABLE_TABLET | ORAL | Status: DC | PRN

## 2011-04-01 MED ORDER — OXYTOCIN 20 UNITS IN LACTATED RINGERS INFUSION - SIMPLE: 125.0000 mL/h | INTRAVENOUS | Status: DC | PRN

## 2011-04-01 MED ORDER — OXYCODONE-ACETAMINOPHEN 5-325 MG PO TABS
1.0000 | ORAL_TABLET | ORAL | Status: DC | PRN
  Administered 2011-04-02: 2 via ORAL
  Filled 2011-04-01: qty 2

## 2011-04-01 MED ORDER — OXYCODONE-ACETAMINOPHEN 5-325 MG PO TABS: 2.0000 | ORAL_TABLET | ORAL | Status: DC | PRN

## 2011-04-01 MED ORDER — TETANUS-DIPHTH-ACELL PERTUSSIS 5-2.5-18.5 LF-MCG/0.5 IM SUSP: 0.5000 mL | Freq: Once | INTRAMUSCULAR | Status: DC

## 2011-04-01 MED ORDER — WITCH HAZEL-GLYCERIN EX PADS: MEDICATED_PAD | CUTANEOUS | Status: DC | PRN

## 2011-04-01 MED ORDER — FLEET ENEMA 7-19 GM/118ML RE ENEM: 1.0000 | ENEMA | RECTAL | Status: DC | PRN

## 2011-04-01 MED ORDER — PHENYLEPHRINE 40 MCG/ML (10ML) SYRINGE FOR IV PUSH (FOR BLOOD PRESSURE SUPPORT)
80.0000 ug | PREFILLED_SYRINGE | INTRAVENOUS | Status: DC | PRN
  Filled 2011-04-01: qty 5

## 2011-04-01 MED ORDER — LACTATED RINGERS IV SOLN
INTRAVENOUS | Status: DC
  Administered 2011-04-01 (×2): 999 mL via INTRAVENOUS

## 2011-04-01 MED ORDER — BENZOCAINE-MENTHOL 20-0.5 % EX AERO: 1.0000 "application " | INHALATION_SPRAY | CUTANEOUS | Status: DC | PRN

## 2011-04-01 MED ORDER — LIDOCAINE HCL (PF) 1 % IJ SOLN
30.0000 mL | INTRAMUSCULAR | Status: DC | PRN
  Filled 2011-04-01: qty 30

## 2011-04-01 MED ORDER — ACETAMINOPHEN 325 MG PO TABS: 650.0000 mg | ORAL_TABLET | ORAL | Status: DC | PRN

## 2011-04-01 NOTE — Progress Notes (Signed)
NSVD  Of viable female. Apgars 9/9. Placenta delivered intact. Baby placed skin-to-skin on mother's chest. Bonding noted.

## 2011-04-01 NOTE — Progress Notes (Signed)
Pt state she had a small gush of clear fluid at 1015. Has been having contractions all the time. No change. Not actively leaking any fluid at this time.

## 2011-04-01 NOTE — Initial Assessments (Signed)
Introduce self to patient and family. Pt comfortable after epidural. Armband verified. Plan of care reviewed. Questions answered. Pt verbalized understanding of plan. Will continue to monitor labor progress.

## 2011-04-01 NOTE — Anesthesia Preprocedure Evaluation (Addendum)
Anesthesia Evaluation  Name, MR# and DOB Patient awake  General Assessment Comment  Reviewed: Allergy & Precautions, H&P  and Patient's Chart, lab work & pertinent test results  Airway Mallampati: II TM Distance: >3 FB Neck ROM: full    Dental No notable dental hx    Pulmonaryneg pulmonary ROS    clear to auscultation  pulmonary exam normal   Cardiovascular regular Normal   Neuro/PsychNegative Neurological ROS Negative Psych ROS  GI/Hepatic/Renal negative GI ROS, negative Liver ROS, and negative Renal ROS (+)       Endo/Other  Negative Endocrine ROS (+)   Abdominal   Musculoskeletal  Hematology negative hematology ROS (+)   Peds  Reproductive/Obstetrics (+) Pregnancy   Anesthesia Other Findings VBAC            Anesthesia Physical Anesthesia Plan  ASA: II  Anesthesia Plan: Epidural   Post-op Pain Management:    Induction:   Airway Management Planned:   Additional Equipment:   Intra-op Plan:   Post-operative Plan:   Informed Consent: I have reviewed the patients History and Physical, chart, labs and discussed the procedure including the risks, benefits and alternatives for the proposed anesthesia with the patient or authorized representative who has indicated his/her understanding and acceptance.     Plan Discussed with: CRNA  Anesthesia Plan Comments: (Plan is to use the epidural for the PPTL.)      Anesthesia Quick Evaluation

## 2011-04-01 NOTE — H&P (Signed)
Subjective:  [name] is a 20 y.o. G2 P1 female with EDC 04/21/11 at 37 and 1/[redacted] weeks gestation who is being admitted for SROM.  Her current obstetrical history is significant for history of prior LTCS for breech presentation at 34 weeks and desire for BTL.  Pt desires VBAC.    Patient reports leakage of fluid since 1000 this morning - all clear fluid..   Fetal Movement: normal.     Past Medical History  Diagnosis Date  . Anxiety    Past Surgical History  Procedure Date  . Cesarean section   . Tonsillectomy    History   Social History  . Marital Status: Single    Spouse Name: N/A    Number of Children: N/A  . Years of Education: N/A   Occupational History  . Not on file.   Social History Main Topics  . Smoking status: Former Smoker -- 1.0 packs/day for 1 years    Types: Cigarettes  . Smokeless tobacco: Never Used  . Alcohol Use: No  . Drug Use: No  . Sexually Active: Yes    Birth Control/ Protection: None     pt is pregnant   Other Topics Concern  . Not on file   Social History Narrative  . No narrative on file   OB History    Grav Para Term Preterm Abortions TAB SAB Ect Mult Living   2 1  1      1      # Outc Date GA Lbr Len/2nd Wgt Sex Del Anes PTL Lv   1 PRE     F LTCS Spinal Yes Yes   Comments: C/S due to breech   2 CUR                Objective:   Vital signs in last 24 hours: Temp:  [97.8 F (36.6 C)-98.4 F (36.9 C)] 97.8 F (36.6 C) (07/20 1545) Pulse Rate:  [53-101] 67  (07/20 1831) Resp:  [16-20] 20  (07/20 1747) BP: (100-126)/(48-71) 126/71 mmHg (07/20 1831) SpO2:  [97 %] 97 % (07/20 1108) Weight:  [68.221 kg (150 lb 6.4 oz)] 150 lb 6.4 oz (68.221 kg) (07/20 1108)   General:   alert  Skin:   normal  HEENT:  extra ocular movement intact  Lungs:   clear to auscultation bilaterally  Heart:   regular rate and rhythm, S1, S2 normal, no murmur, click, rub or gallop  Breasts:   normal without suspicious masses, skin or nipple changes or  axillary nodes and self-exam is taught and encouraged  Abdomen:  soft, non-tender; bowel sounds normal; no masses,  no organomegaly  Pelvis:  Cervix: 3/80/-2, AROM of forebag with clear fluid noted  FHT:  120's BPM  Uterine Size: size equals dates  Presentations: cephalic  Cervix:    Dilation: 3cm   Effacement: 80%   Station:  -2   Consistency: medium   Position: middle   Lab Review  O, Rh+, Rubella-immune, Hepatitis B surface antigen non-reactive, GBS negative  AFP:NML  One hour GTT: Normal    Assessment/Plan:  37 and 1/[redacted] weeks gestation. SROM  - Labor:  Pt initially was 2/80/-2 upon admission at 1400 this afternoon.  She was started on pitocin for induction by Dr Richardson Dopp (who was covering until 5pm) after no cervical change was noted over a few hours.  Pt is currently on 4 mU/min.  IUPC placed for closer monitoring of contractions.  Will titrate pitocin as needed to  maintain adequate contractions.  Pt is not currently yet in active labor. - Fetal status reassuring.  Mild variables noted after placing IUPC, which resolved with position changes. - Hx prior LTCS:  Pt counseled during antepartum visits and again upon admission regarding the R/B/I/A of repeat C/S vs. VBAC to include 1% risk of uterine rupture, and she still desires VBAC. Obstetrical history significant for prior c-section, desire for tubal sterilization.  - Pt also desires BTL.  This was also reviewed with the patient. - Pain: Pt did not desire any pain medication until her exam that I just performed, and now she desires a CLE.  Anesthesia consulted.    Risks, benefits, alternatives and possible complications have been discussed in detail with the patient.  Pre-admission, admission, and post admission procedures and expectations were discussed in detail.  All questions answered, all appropriate consents will be signed at the Hospital. Admission is planned for today.  Augmentation: IV Pitocin induction. and Analgesia: CLE

## 2011-04-01 NOTE — Progress Notes (Signed)
Pt states " gush of clear fluid around 1030 today"

## 2011-04-01 NOTE — Progress Notes (Signed)
I was asked to do speculum exam to r/o SROM. Fern test positive. Cervix 2/80/-2.

## 2011-04-01 NOTE — Anesthesia Procedure Notes (Addendum)
Epidural Patient location during procedure: OB Start time: 04/01/2011 7:01 PM Reason for block: procedure for pain  Staffing Anesthesiologist: Brayton Caves R Performed by: anesthesiologist   Preanesthetic Checklist Completed: patient identified, site marked, surgical consent, pre-op evaluation, timeout performed, IV checked, risks and benefits discussed and monitors and equipment checked  Epidural Patient position: sitting Prep: site prepped and draped and DuraPrep Patient monitoring: continuous pulse ox and blood pressure Approach: midline Injection technique: LOR air  Needle:  Needle type: Tuohy  Needle gauge: 17 G Needle length: 9 cm Needle insertion depth: 5 cm cm Catheter type: closed end flexible Catheter size: 19 Gauge Catheter at skin depth: 10 cm Test dose: negative  Assessment Events: blood not aspirated, injection not painful, no injection resistance, negative IV test and no paresthesia  Additional Notes Patient identified.  Risk benefits discussed including failed block, incomplete pain control, headache, nerve damage, paralysis, blood pressure changes, nausea, vomiting, reactions to medication both toxic or allergic, and postpartum back pain.  Patient expressed understanding and wished to proceed.  All questions were answered.  Sterile technique used throughout procedure and epidural site dressed with sterile barrier dressing. No paresthesia or other complications noted. 5 minutes prior to starting epidural infusion of fentanyl and bupivicaine, the epidural was loaded with 9 ml of 1.5% lidocaine in three separate doses.  The patient did not experience any signs of intravascular injection such as tinnitus or metallic taste in mouth nor signs of intrathecal spread such as rapid motor block. Please see nursing notes for vital signs.

## 2011-04-02 ENCOUNTER — Encounter (HOSPITAL_COMMUNITY): Payer: Self-pay | Admitting: Anesthesiology

## 2011-04-02 DIAGNOSIS — O34219 Maternal care for unspecified type scar from previous cesarean delivery: Secondary | ICD-10-CM | POA: Diagnosis not present

## 2011-04-02 LAB — CBC
Hemoglobin: 11.4 g/dL — ABNORMAL LOW (ref 12.0–15.0)
MCH: 31 pg (ref 26.0–34.0)
MCHC: 34 g/dL (ref 30.0–36.0)
Platelets: 230 10*3/uL (ref 150–400)
RDW: 12.7 % (ref 11.5–15.5)

## 2011-04-02 LAB — TYPE AND SCREEN
ABO/RH(D): O POS
Antibody Screen: NEGATIVE

## 2011-04-02 MED ORDER — TETANUS-DIPHTH-ACELL PERTUSSIS 5-2.5-18.5 LF-MCG/0.5 IM SUSP
0.5000 mL | Freq: Once | INTRAMUSCULAR | Status: AC
  Administered 2011-04-03: 0.5 mL via INTRAMUSCULAR
  Filled 2011-04-02: qty 0.5

## 2011-04-02 MED ORDER — LANOLIN HYDROUS EX OINT: TOPICAL_OINTMENT | CUTANEOUS | Status: DC | PRN

## 2011-04-02 MED ORDER — LACTATED RINGERS IV SOLN
INTRAVENOUS | Status: DC
  Administered 2011-04-02: 09:00:00 via INTRAVENOUS

## 2011-04-02 MED ORDER — WITCH HAZEL-GLYCERIN EX PADS: MEDICATED_PAD | CUTANEOUS | Status: DC | PRN

## 2011-04-02 MED ORDER — OXYTOCIN 20 UNITS IN LACTATED RINGERS INFUSION - SIMPLE: 125.0000 mL/h | INTRAVENOUS | Status: DC | PRN

## 2011-04-02 MED ORDER — SENNOSIDES-DOCUSATE SODIUM 8.6-50 MG PO TABS
1.0000 | ORAL_TABLET | Freq: Every day | ORAL | Status: DC
  Administered 2011-04-02: 1 via ORAL

## 2011-04-02 MED ORDER — ZOLPIDEM TARTRATE 5 MG PO TABS: 5.0000 mg | ORAL_TABLET | Freq: Every evening | ORAL | Status: DC | PRN

## 2011-04-02 MED ORDER — ONDANSETRON HCL 4 MG/2ML IJ SOLN: 4.0000 mg | INTRAMUSCULAR | Status: DC | PRN

## 2011-04-02 MED ORDER — BENZOCAINE-MENTHOL 20-0.5 % EX AERO: 1.0000 "application " | INHALATION_SPRAY | CUTANEOUS | Status: DC | PRN

## 2011-04-02 MED ORDER — IBUPROFEN 600 MG PO TABS
600.0000 mg | ORAL_TABLET | Freq: Four times a day (QID) | ORAL | Status: DC
  Administered 2011-04-02 – 2011-04-03 (×6): 600 mg via ORAL
  Filled 2011-04-02 (×6): qty 1

## 2011-04-02 MED ORDER — DIPHENHYDRAMINE HCL 25 MG PO CAPS: 25.0000 mg | ORAL_CAPSULE | Freq: Four times a day (QID) | ORAL | Status: DC | PRN

## 2011-04-02 MED ORDER — BENZOCAINE-MENTHOL 20-0.5 % EX AERO
INHALATION_SPRAY | CUTANEOUS | Status: AC
  Filled 2011-04-02: qty 56

## 2011-04-02 MED ORDER — ONDANSETRON HCL 4 MG PO TABS: 4.0000 mg | ORAL_TABLET | ORAL | Status: DC | PRN

## 2011-04-02 MED ORDER — SIMETHICONE 80 MG PO CHEW: 80.0000 mg | CHEWABLE_TABLET | ORAL | Status: DC | PRN

## 2011-04-02 MED ORDER — OXYCODONE-ACETAMINOPHEN 5-325 MG PO TABS: 1.0000 | ORAL_TABLET | ORAL | Status: DC | PRN

## 2011-04-02 MED ORDER — FAMOTIDINE 20 MG PO TABS
40.0000 mg | ORAL_TABLET | Freq: Once | ORAL | Status: AC
  Administered 2011-04-02: 40 mg via ORAL
  Filled 2011-04-02: qty 2

## 2011-04-02 MED ORDER — PRENATAL PLUS 27-1 MG PO TABS
1.0000 | ORAL_TABLET | Freq: Every day | ORAL | Status: DC
  Administered 2011-04-02 – 2011-04-03 (×2): 1 via ORAL
  Filled 2011-04-02 (×2): qty 1

## 2011-04-02 NOTE — Progress Notes (Signed)
Post Partum Day 1 Subjective: no complaints.  Pt reports feeling well.  Lochia diminishing.  Breast feeding/pumping.  Pain well controlled.  Pt still desires BTL.  NPO now for BTL.  Objective: Blood pressure 100/64, pulse 65, temperature 97.8 F (36.6 C), temperature source Oral, resp. rate 18, height 5\' 6"  (1.676 m), weight 68.221 kg (150 lb 6.4 oz), SpO2 94.00%.  Physical Exam:  General: alert and cooperative Lochia: appropriate Uterine Fundus: firm at U-2/midline/firm Incision: healing well DVT Evaluation: No evidence of DVT seen on physical exam.   Basename 04/02/11 0530 04/01/11 1345  HGB 11.4* 13.4  HCT 33.5* 38.4    Assessment/Plan:   PPD#1 Breastfeeding and Contraception BTL Pt currently NPO with IVF in preparation for postpartum BTL today, currently scheduled for 1230.  All R/B/I/A reviewed again in detail to include risks of surgery to include bleeding, infection, damage to surrounding structures and organs, risk of failure and 50% risk of regret due to her young age.  All questions answered.  She desires to proceed.   LOS: 1 day   Malcolm Metro 04/02/2011, 10:28 AM

## 2011-04-02 NOTE — Progress Notes (Signed)
Pt transferred to [facility] Room 105 via w/c in stable condition with belongings, baby in crib, and husband.

## 2011-04-03 ENCOUNTER — Inpatient Hospital Stay (HOSPITAL_COMMUNITY): Payer: Medicaid Other | Admitting: Anesthesiology

## 2011-04-03 ENCOUNTER — Encounter (HOSPITAL_COMMUNITY): Payer: Self-pay | Admitting: Anesthesiology

## 2011-04-03 ENCOUNTER — Encounter (HOSPITAL_COMMUNITY)
Admission: AD | Disposition: A | Discharge status: Home / Self Care | Payer: Self-pay | Source: Ambulatory Visit | Attending: Obstetrics and Gynecology

## 2011-04-03 ENCOUNTER — Encounter (HOSPITAL_COMMUNITY): Payer: Self-pay | Admitting: Obstetrics & Gynecology

## 2011-04-03 DIAGNOSIS — O34219 Maternal care for unspecified type scar from previous cesarean delivery: Secondary | ICD-10-CM

## 2011-04-03 SURGERY — LIGATION, FALLOPIAN TUBE, POSTPARTUM
Anesthesia: Epidural | Laterality: Bilateral

## 2011-04-03 MED ORDER — LIDOCAINE-EPINEPHRINE (PF) 2 %-1:200000 IJ SOLN
INTRAMUSCULAR | Status: AC
  Filled 2011-04-03: qty 20

## 2011-04-03 MED ORDER — METOCLOPRAMIDE HCL 10 MG PO TABS
10.0000 mg | ORAL_TABLET | Freq: Once | ORAL | Status: AC
  Administered 2011-04-03: 10 mg via ORAL
  Filled 2011-04-03: qty 1

## 2011-04-03 MED ORDER — IBUPROFEN 600 MG PO TABS: 600.0000 mg | ORAL_TABLET | Freq: Four times a day (QID) | ORAL | Status: AC

## 2011-04-03 MED ORDER — OXYCODONE-ACETAMINOPHEN 5-325 MG PO TABS: 1.0000 | ORAL_TABLET | ORAL | Status: AC | PRN

## 2011-04-03 MED ORDER — LACTATED RINGERS IV SOLN
INTRAVENOUS | Status: DC
  Administered 2011-04-03: 20 mL/h via INTRAVENOUS

## 2011-04-03 MED ORDER — SODIUM BICARBONATE 8.4 % IV SOLN
INTRAVENOUS | Status: AC
  Filled 2011-04-03: qty 50

## 2011-04-03 MED ORDER — FAMOTIDINE 20 MG PO TABS
40.0000 mg | ORAL_TABLET | Freq: Once | ORAL | Status: AC
  Administered 2011-04-03: 40 mg via ORAL
  Filled 2011-04-03: qty 2

## 2011-04-03 SURGICAL SUPPLY — 21 items
CLOTH BEACON ORANGE TIMEOUT ST (SAFETY) ×2 IMPLANT
DERMABOND ADVANCED (GAUZE/BANDAGES/DRESSINGS) IMPLANT
DRAPE UTILITY XL STRL (DRAPES) ×2 IMPLANT
GLOVE BIO SURGEON STRL SZ 6.5 (GLOVE) ×2 IMPLANT
GLOVE BIOGEL PI IND STRL 7.0 (GLOVE) ×2 IMPLANT
GLOVE BIOGEL PI INDICATOR 7.0 (GLOVE) ×2
GOWN BRE IMP SLV AUR XL STRL (GOWN DISPOSABLE) ×4 IMPLANT
NEEDLE HYPO 22GX1.5 SAFETY (NEEDLE) ×2 IMPLANT
NS IRRIG 1000ML POUR BTL (IV SOLUTION) ×2 IMPLANT
PACK ABDOMINAL MINOR (CUSTOM PROCEDURE TRAY) ×2 IMPLANT
PENCIL BUTTON HOLSTER BLD 10FT (ELECTRODE) ×2 IMPLANT
SPONGE LAP 4X18 X RAY DECT (DISPOSABLE) IMPLANT
SUT CHROMIC 2 0 SH (SUTURE) ×2 IMPLANT
SUT PLAIN 2 0 (SUTURE) ×1
SUT PLAIN ABS 2-0 54XMFL TIE (SUTURE) ×1 IMPLANT
SUT VIC AB 0 CT2 27 (SUTURE) ×4 IMPLANT
SUT VICRYL 4-0 PS2 18IN ABS (SUTURE) ×2 IMPLANT
SYR CONTROL 10ML LL (SYRINGE) ×2 IMPLANT
TOWEL OR 17X24 6PK STRL BLUE (TOWEL DISPOSABLE) ×4 IMPLANT
TRAY FOLEY CATH 14FR (SET/KITS/TRAYS/PACK) ×2 IMPLANT
WATER STERILE IRR 1000ML POUR (IV SOLUTION) ×2 IMPLANT

## 2011-04-03 NOTE — Progress Notes (Signed)
When epidural removed, tip intact and patient tolerated well.

## 2011-04-03 NOTE — Discharge Summary (Addendum)
Obstetric Discharge Summary Reason for Admission: onset of labor and rupture of membranes Prenatal Procedures: none Intrapartum Procedures: spontaneous vaginal delivery Postpartum Procedures: none Complications-Operative and Postpartum: none  Hemoglobin  Date Value Range Status  04/02/2011 11.4* 12.0-15.0 (g/dL) Final     HCT  Date Value Range Status  04/02/2011 33.5* 36.0-46.0 (%) Final    Discharge Diagnoses: Preterm delivery, delivered; vaginal delivery after cesarean, delivered.  Discharge Information: Date: 04/03/2011 Activity: pelvic rest Diet: routine Medications: PNV, Ibuprophen and Colace.  Micronor for birth control.  Pt was on the schedule for a postpartum BTL after delivery, however, due to multiple delays due to other surgical emergencies, the patient decided to cancel the surgery on both PPD#1 and PPD#2.  She plans to proceed with L/S BTL postpartum.  Condition: stable Instructions: refer to practice specific booklet Discharge to: home Follow-up Information    Follow up with Fortino Sic, MD in 6 weeks.   Contact information:   [address] [place] [address] [phone]          Home with mother.  Liberty Handy MILSAW 04/03/2011, 1:35 PM

## 2011-04-03 NOTE — Anesthesia Preprocedure Evaluation (Signed)
Anesthesia Evaluation  Name, MR# and DOB Patient awake  General Assessment Comment  Reviewed: Allergy & Precautions, H&P  and Patient's Chart, lab work & pertinent test results  Airway Mallampati: II TM Distance: >3 FB Neck ROM: full    Dental No notable dental hx    Pulmonaryneg pulmonary ROS    clear to auscultation  pulmonary exam normal   Cardiovascular regular Normal   Neuro/PsychNegative Neurological ROS Negative Psych ROS  GI/Hepatic/Renal negative GI ROS, negative Liver ROS, and negative Renal ROS (+)       Endo/Other  Negative Endocrine ROS (+)   Abdominal   Musculoskeletal  Hematology negative hematology ROS (+)   Peds  Reproductive/Obstetrics   Anesthesia Other Findings             Anesthesia Physical Anesthesia Plan  ASA: II  Anesthesia Plan: Bier Block   Post-op Pain Management:    Induction:   Airway Management Planned:   Additional Equipment:   Intra-op Plan:   Post-operative Plan:   Informed Consent: I have reviewed the patients History and Physical, chart, labs and discussed the procedure including the risks, benefits and alternatives for the proposed anesthesia with the patient or authorized representative who has indicated his/her understanding and acceptance.     Plan Discussed with:   Anesthesia Plan Comments:         Anesthesia Quick Evaluation

## 2011-04-03 NOTE — Progress Notes (Signed)
Mom w/hx of oversupply.  Mom cautioned not to pump too often.  Mom given milk storage guidelines.  Mom w/no concerns; says may try to put baby to breast when goes home. [name]

## 2011-04-03 NOTE — Anesthesia Postprocedure Evaluation (Signed)
  Anesthesia Post-op Note  Patient: [name]  Procedure(s) Performed: * No procedures list  Patient's cardiopulmonary status is stable Patient's level of consciousness: sedate but responsive verbally Pain and nausea are all reasonably controlled No anesthetic complications apparent at this time No follow up care necessary at this time

## 2011-04-04 NOTE — Progress Notes (Signed)
UR chart review completed.  

## 2011-04-27 ENCOUNTER — Encounter (HOSPITAL_COMMUNITY): Payer: Self-pay | Admitting: Obstetrics and Gynecology

## 2011-06-03 LAB — CBC
HCT: 41.2
MCHC: 34
MCV: 89.6
Platelets: 165

## 2011-06-03 LAB — BASIC METABOLIC PANEL
BUN: 6
CO2: 27
Chloride: 104
Creatinine, Ser: 0.74
Glucose, Bld: 104 — ABNORMAL HIGH
Potassium: 3.8

## 2011-06-03 LAB — URINALYSIS, ROUTINE W REFLEX MICROSCOPIC
Hgb urine dipstick: NEGATIVE
Nitrite: NEGATIVE
Protein, ur: 30 — AB
Specific Gravity, Urine: 1.031 — ABNORMAL HIGH
Urobilinogen, UA: 1

## 2011-06-03 LAB — DIFFERENTIAL
Basophils Relative: 1
Eosinophils Absolute: 0
Eosinophils Relative: 0
Lymphs Abs: 0.8
Monocytes Relative: 8
Neutrophils Relative %: 82 — ABNORMAL HIGH

## 2011-06-03 LAB — INFLUENZA A+B VIRUS AG-DIRECT(RAPID): Inflenza A Ag: NEGATIVE

## 2011-06-03 LAB — URINE MICROSCOPIC-ADD ON

## 2011-06-03 LAB — PREGNANCY, URINE: Preg Test, Ur: NEGATIVE

## 2011-06-28 LAB — POCT URINALYSIS DIP (DEVICE)
Glucose, UA: NEGATIVE
Ketones, ur: NEGATIVE
Operator id: 235561
Specific Gravity, Urine: 1.025
Urobilinogen, UA: 2 — ABNORMAL HIGH

## 2011-06-28 LAB — URINALYSIS, ROUTINE W REFLEX MICROSCOPIC
Bilirubin Urine: NEGATIVE
Ketones, ur: NEGATIVE
Protein, ur: NEGATIVE
Urobilinogen, UA: 1

## 2011-06-28 LAB — CBC
HCT: 41.8
Hemoglobin: 14.3
RBC: 4.57
WBC: 9.3

## 2011-06-28 LAB — DIFFERENTIAL
Basophils Absolute: 0
Basophils Relative: 0
Monocytes Absolute: 1.3 — ABNORMAL HIGH
Neutro Abs: 6.6

## 2011-06-28 LAB — PREGNANCY, URINE: Preg Test, Ur: NEGATIVE

## 2011-06-28 LAB — COMPREHENSIVE METABOLIC PANEL
Albumin: 3.3 — ABNORMAL LOW
Alkaline Phosphatase: 70
BUN: 6
Chloride: 99
Glucose, Bld: 82
Potassium: 3.3 — ABNORMAL LOW
Total Bilirubin: 0.7

## 2011-06-28 LAB — URINE CULTURE

## 2011-06-28 LAB — URINE MICROSCOPIC-ADD ON

## 2011-12-14 ENCOUNTER — Other Ambulatory Visit: Payer: Self-pay | Admitting: Medical

## 2011-12-14 ENCOUNTER — Encounter: Payer: Self-pay | Admitting: Medical

## 2011-12-14 ENCOUNTER — Ambulatory Visit (INDEPENDENT_AMBULATORY_CARE_PROVIDER_SITE_OTHER): Payer: Managed Care, Other (non HMO) | Admitting: Medical

## 2011-12-14 VITALS — BP 110/70 | HR 64 | Temp 97.9°F | Resp 16 | Ht 66.0 in | Wt 139.0 lb

## 2011-12-14 DIAGNOSIS — Z113 Encounter for screening for infections with a predominantly sexual mode of transmission: Secondary | ICD-10-CM

## 2011-12-14 DIAGNOSIS — R51 Headache: Secondary | ICD-10-CM

## 2011-12-14 DIAGNOSIS — N898 Other specified noninflammatory disorders of vagina: Secondary | ICD-10-CM

## 2011-12-14 DIAGNOSIS — F411 Generalized anxiety disorder: Secondary | ICD-10-CM

## 2011-12-14 DIAGNOSIS — Z0000 Encounter for general adult medical examination without abnormal findings: Secondary | ICD-10-CM

## 2011-12-14 DIAGNOSIS — K5900 Constipation, unspecified: Secondary | ICD-10-CM

## 2011-12-14 DIAGNOSIS — F17200 Nicotine dependence, unspecified, uncomplicated: Secondary | ICD-10-CM

## 2011-12-14 DIAGNOSIS — N949 Unspecified condition associated with female genital organs and menstrual cycle: Secondary | ICD-10-CM

## 2011-12-14 DIAGNOSIS — R102 Pelvic and perineal pain: Secondary | ICD-10-CM

## 2011-12-14 DIAGNOSIS — F419 Anxiety disorder, unspecified: Secondary | ICD-10-CM

## 2011-12-14 LAB — POCT URINALYSIS DIPSTICK
Bilirubin, UA: NEGATIVE
Glucose, UA: NEGATIVE
Nitrite, UA: NEGATIVE

## 2011-12-14 LAB — POCT URINE PREGNANCY: Preg Test, Ur: NEGATIVE

## 2011-12-14 MED ORDER — CITALOPRAM HYDROBROMIDE 20 MG PO TABS: 20.0000 mg | ORAL_TABLET | Freq: Every day | ORAL | Status: DC

## 2011-12-14 MED ORDER — POLYETHYLENE GLYCOL 3350 17 GM/SCOOP PO POWD: 17.0000 g | Freq: Every day | ORAL | Status: AC

## 2011-12-14 MED ORDER — OMEPRAZOLE 40 MG PO CPDR: 40.0000 mg | DELAYED_RELEASE_CAPSULE | Freq: Every day | ORAL | Status: DC

## 2011-12-15 ENCOUNTER — Encounter: Payer: Self-pay | Admitting: Medical

## 2011-12-15 DIAGNOSIS — F17200 Nicotine dependence, unspecified, uncomplicated: Secondary | ICD-10-CM | POA: Insufficient documentation

## 2011-12-15 DIAGNOSIS — F419 Anxiety disorder, unspecified: Secondary | ICD-10-CM | POA: Insufficient documentation

## 2011-12-15 DIAGNOSIS — K5900 Constipation, unspecified: Secondary | ICD-10-CM | POA: Insufficient documentation

## 2011-12-15 LAB — LIPID PANEL
Cholesterol: 99 mg/dL (ref 0–200)
VLDL: 7 mg/dL (ref 0–40)

## 2011-12-15 LAB — CBC WITH DIFFERENTIAL/PLATELET
Basophils Absolute: 0.1 10*3/uL (ref 0.0–0.1)
Basophils Relative: 1 % (ref 0–1)
Eosinophils Absolute: 0.2 10*3/uL (ref 0.0–0.7)
MCH: 30.4 pg (ref 26.0–34.0)
MCHC: 32.7 g/dL (ref 30.0–36.0)
Monocytes Absolute: 0.4 10*3/uL (ref 0.1–1.0)
Neutro Abs: 5.1 10*3/uL (ref 1.7–7.7)
Neutrophils Relative %: 61 % (ref 43–77)
RDW: 12.8 % (ref 11.5–15.5)

## 2011-12-15 LAB — COMPREHENSIVE METABOLIC PANEL
ALT: 13 U/L (ref 0–35)
AST: 14 U/L (ref 0–37)
Albumin: 4.4 g/dL (ref 3.5–5.2)
Calcium: 9.1 mg/dL (ref 8.4–10.5)
Chloride: 108 mEq/L (ref 96–112)
Potassium: 4.2 mEq/L (ref 3.5–5.3)
Sodium: 142 mEq/L (ref 135–145)

## 2011-12-15 LAB — GC/CHLAMYDIA PROBE AMP, GENITAL
Chlamydia, DNA Probe: NEGATIVE
GC Probe Amp, Genital: NEGATIVE

## 2011-12-15 NOTE — Patient Instructions (Signed)

## 2011-12-15 NOTE — Progress Notes (Signed)
Subjective:   HPI  [name] is a 21 y.o. female who presents for a complete physical.  Here as a new patient today.  [name] is a patient here.  She notes several ongoing concerns.  1) for the last several months she has been getting daily headaches, that can be left parietal, right parietal, frontal behind eyes, can get photophobias, sonophobia, nausea, headaches can be sharp or throbbing constant.  She denies hx/o headache disorder or migraines 2) she had Hannah last child in fall 2012, but just recently went back to work in the last few months.  Since then she has been having anxiety, having dreams fearing that [name] have died, has decreased desire to do things he enjoys, and she thinks it could be separation anxiety.  Hannah boyfriend is unemployed and he takes care of their 2 Murphy during the day.  Kids not in daycare.  She says Hannah relationship with him is fine, no c/o. 3) she has several years hx/o reflux, uses OTC Prilosec without relief 4) she notes hx/o constipation and hemorrhoids, tries to eat fiber and drink plenty of water, and has used miralax in the past.  She has BM every 3 days on average 5) lately been having pelvic pain and slight vaginal discharge.  No new sexual partners, no concern for STD.  She has mirena placed 8/12 (Dr. Charlies Silvers, [facility] in [facility]), and some of these symptoms have been since then 6) she has seen dermatology prior for there birthmarks, not sure of the diagnosis, but no changes 7) she has hx/o 2 pregnancies and 2 live birth  Reviewed their medical, surgical, family, social, medication, and allergy history and updated chart as appropriate.  Past Medical History  Diagnosis Date  . Anxiety   . Previous cesarean delivery, antepartum condition or complication 04/01/2011  . History of kidney stones     x 4, first time age 21yo  . History of UTI     several  . Anemia     with pregnancy  . Wears glasses   . Chronic headaches     began  late 2012  . GERD (gastroesophageal reflux disease)   . Seasonal allergic rhinitis   . Depression     with prior parental divorce, resolved  . Contraception, device intrauterine 04/2011    Mirena    Past Surgical History  Procedure Date  . Cesarean section   . Tonsillectomy     Family History  Problem Relation Age of Onset  . Hypertension Murphy   . Cancer Murphy 38    colon  . Diabetes Murphy     borderline  . Thyroid disease Murphy   . Depression Murphy   . Anxiety disorder Murphy   . Hypertension Maternal Grandfather   . Heart disease Maternal Grandfather   . Other Father     unknown  . Cancer Father     brain cancer?  . ADD / ADHD Sister   . Heart disease Maternal Uncle   . Diabetes Maternal Grandmother   . Stroke Neg Hx   . Cancer Other     others on maternal side    History   Social History  . Marital Status: Single    Spouse Name: N/A    Number of Murphy: N/A  . Years of Education: N/A   Occupational History  . insurance agent, [employer]    Social History Main Topics  . Smoking status: Current Everyday Smoker -- 0.5  packs/day for 7 years    Types: Cigarettes  . Smokeless tobacco: Never Used  . Alcohol Use: No  . Drug Use: No  . Sexually Active: Yes    Birth Control/ Protection: None     pt is pregnant   Other Topics Concern  . Not on file   Social History Narrative   Single, 2 Murphy, ages 3yo and 9mo, exercise - running after [name]    Current Outpatient Prescriptions on File Prior to Visit  Medication Sig Dispense Refill  . levonorgestrel (MIRENA) 20 MCG/24HR IUD 1 each by Intrauterine route once.      . Omeprazole Magnesium (PRILOSEC OTC PO) Take by mouth.      . citalopram (CELEXA) 20 MG tablet Take 1 tablet (20 mg total) by mouth daily. 1/2 tablet QHS x 1wk, then 1 tablet QHS  30 tablet  2  . omeprazole (PRILOSEC) 40 MG capsule Take 1 capsule (40 mg total) by mouth daily. 1 capsule 30-45 minutes prior to breakfast  30  capsule  3    No Known Allergies   Review of Systems Constitutional: denies fever, chills, sweats, unexpected weight change, anorexia, fatigue Allergy: negative; denies recent sneezing, itching, congestion Dermatology: denies changing moles, rash, lumps, new worrisome lesions ENT: no runny nose, ear pain, sore throat, hoarseness, sinus pain, teeth pain, tinnitus, hearing loss, epistaxis Cardiology: denies chest pain, palpitations, edema, orthopnea, paroxysmal nocturnal dyspnea Respiratory: denies cough, shortness of breath, dyspnea on exertion, wheezing, hemoptysis Gastroenterology: +pelvic/lower abdominal pain, +nausea, -vomiting, -diarrhea, +constipation, -blood in stool, -changes in bowel movement, -dysphagia Hematology: denies bleeding or bruising problems Musculoskeletal: denies arthralgias, myalgias, joint swelling, back pain, neck pain, cramping, gait changes Ophthalmology: denies vision changes, eye redness, itching, discharge Urology: denies dysuria, difficulty urinating, hematuria, urinary frequency, urgency, incontinence Neurology: denies weakness, tingling, numbness, speech abnormality, memory loss, falls, dizziness Psychology: denies depressed mood, agitation, +anhedonia, +anxiety, +worrying, no hallucinations     Objective:   Physical Exam  Filed Vitals:   12/14/11 0919  BP: 110/70  Pulse: 64  Temp: 97.9 F (36.6 C)  Resp: 16    General appearance: alert, no distress, WD/WN, lean white female Skin: right forehead with 2 flat amorphous purplish areas that she reports is birthmark,left lower cheek with 3mm raised pink papule unchanged since birth per pt, right lateral breast with brown flat macules she reports to be birthmark as well, and small brown flat macules of right lower breat unchanged.  otherwise scatted benign appearing macules HEENT: normocephalic, conjunctiva/corneas normal, sclerae anicteric, PERRLA, EOMi, nares patent, no discharge or erythema, pharynx  normal Oral cavity: MMM, tongue normal, teeth in good repair Neck: supple, no lymphadenopathy, no thyromegaly, no masses, normal ROM, no bruits Chest: non tender, normal shape and expansion Heart: RRR, normal S1, S2, no murmurs Lungs: CTA bilaterally, no wheezes, rhonchi, or rales Abdomen: +bs, soft, mild lower abdominal tenderness, otherwise non tender, non distended, no masses, no hepatomegaly, no splenomegaly, no bruits Back: non tender, normal ROM, no scoliosis Musculoskeletal: upper extremities non tender, no obvious deformity, normal ROM throughout, lower extremities non tender, no obvious deformity, normal ROM throughout Extremities: no edema, no cyanosis, no clubbing Pulses: 2+ symmetric, upper and lower extremities, normal cap refill Neurological: alert, oriented x 3, CN2-12 intact, strength normal upper extremities and lower extremities, sensation normal throughout, DTRs 2+ throughout, no cerebellar signs, gait normal Psychiatric: normal affect, behavior normal, pleasant  Breast: nontender, no masses or lumps, no skin changes, no nipple discharge or inversion, no  axillary lymphadenopathy Gyn: Normal external genitalia without lesions, vagina with normal mucosa, cervix without lesions, no cervical motion tenderness, small amount of white vaginal discharge.  Uterus and adnexa not enlarged, nontender, no masses.  Swabs taken.  Exam chaperoned by nurse. Rectal: few external hemorrhoids, no other masses, occult negative stool    Assessment and Plan :     Encounter Diagnoses  Name Primary?  . Physical exam, annual Yes  . Screening for STD (sexually transmitted disease)   . Vaginal Discharge   . Pelvic pain   . Constipation   . Anxiety   . Chronic headache   . Tobacco use disorder    Physical exam - discussed healthy lifestyle, diet, exercise, preventative care, vaccinations, and addressed their concerns.  Handout given.  I reviewed labs from past electronic records over the past  6-12 months including negative RPR and HIV, labs showing anemia which she was not aware of, and screening labs today.  Of note, prior chart records has mention of tubal ligation but she denies and is on mirena which is seen in place today on exam.  We will request prior gyn records.  Pelvic pain, vaginal discharge, screening for STD - GC/chlamydia screening today.  Will send urine for trichomonad screen.  Wet prep in house today with no obvious trich, yeast, BV.    Constipation - begin Miralax daily, c/t good fiber and water intake.    Anxiety, headaches, morning nausea - urine pregnancy negative.  Advised she begin Celexa daily QHS to help with Hannah recent anxiety, which may hopefully help the headaches too.  Recheck 2-3 wk.  Tobacco use disorder - recommended she stop tobacco completley.  Follow-up pending labs

## 2011-12-19 ENCOUNTER — Other Ambulatory Visit: Payer: Self-pay | Admitting: Medical

## 2011-12-19 MED ORDER — CIPROFLOXACIN HCL 500 MG PO TABS: 500.0000 mg | ORAL_TABLET | Freq: Two times a day (BID) | ORAL | Status: AC

## 2011-12-28 ENCOUNTER — Ambulatory Visit: Payer: Managed Care, Other (non HMO) | Admitting: Medical

## 2012-04-08 ENCOUNTER — Encounter (HOSPITAL_COMMUNITY): Payer: Self-pay | Admitting: Emergency Medicine

## 2012-04-08 ENCOUNTER — Emergency Department (HOSPITAL_COMMUNITY): Payer: Self-pay

## 2012-04-08 ENCOUNTER — Emergency Department (HOSPITAL_COMMUNITY)
Admission: EM | Admit: 2012-04-08 | Discharge: 2012-04-08 | Disposition: A | Discharge status: Home / Self Care | Payer: Self-pay | Attending: Emergency Medicine | Admitting: Emergency Medicine

## 2012-04-08 DIAGNOSIS — R109 Unspecified abdominal pain: Secondary | ICD-10-CM

## 2012-04-08 DIAGNOSIS — K219 Gastro-esophageal reflux disease without esophagitis: Secondary | ICD-10-CM | POA: Insufficient documentation

## 2012-04-08 DIAGNOSIS — F17200 Nicotine dependence, unspecified, uncomplicated: Secondary | ICD-10-CM | POA: Insufficient documentation

## 2012-04-08 DIAGNOSIS — N200 Calculus of kidney: Secondary | ICD-10-CM | POA: Insufficient documentation

## 2012-04-08 DIAGNOSIS — R112 Nausea with vomiting, unspecified: Secondary | ICD-10-CM

## 2012-04-08 LAB — CBC
MCH: 31 pg (ref 26.0–34.0)
MCHC: 35.1 g/dL (ref 30.0–36.0)
MCV: 88.1 fL (ref 78.0–100.0)
Platelets: 225 10*3/uL (ref 150–400)
RBC: 4.81 MIL/uL (ref 3.87–5.11)
RDW: 12.1 % (ref 11.5–15.5)

## 2012-04-08 LAB — URINALYSIS, ROUTINE W REFLEX MICROSCOPIC
Ketones, ur: 40 mg/dL — AB
Nitrite: NEGATIVE
Protein, ur: 30 mg/dL — AB
Urobilinogen, UA: 1 mg/dL (ref 0.0–1.0)

## 2012-04-08 LAB — PREGNANCY, URINE: Preg Test, Ur: NEGATIVE

## 2012-04-08 LAB — BASIC METABOLIC PANEL
CO2: 23 mEq/L (ref 19–32)
Calcium: 9.1 mg/dL (ref 8.4–10.5)
Creatinine, Ser: 0.69 mg/dL (ref 0.50–1.10)
GFR calc Af Amer: >90 mL/min (ref >90)
GFR calc non Af Amer: >90 mL/min (ref >90)
Sodium: 139 mEq/L (ref 135–145)

## 2012-04-08 LAB — URINE MICROSCOPIC-ADD ON

## 2012-04-08 MED ORDER — ONDANSETRON HCL 4 MG/2ML IJ SOLN
4.0000 mg | Freq: Once | INTRAMUSCULAR | Status: AC
  Administered 2012-04-08: 4 mg via INTRAVENOUS
  Filled 2012-04-08: qty 2

## 2012-04-08 MED ORDER — SODIUM CHLORIDE 0.9 % IV SOLN
Freq: Once | INTRAVENOUS | Status: AC
  Administered 2012-04-08: 11:00:00 via INTRAVENOUS

## 2012-04-08 MED ORDER — ONDANSETRON HCL 4 MG/2ML IJ SOLN
INTRAMUSCULAR | Status: AC
  Administered 2012-04-08: 10:00:00
  Filled 2012-04-08: qty 2

## 2012-04-08 MED ORDER — HYDROCODONE-ACETAMINOPHEN 5-325 MG PO TABS: 2.0000 | ORAL_TABLET | ORAL | Status: AC | PRN

## 2012-04-08 MED ORDER — HYDROMORPHONE HCL PF 1 MG/ML IJ SOLN
1.0000 mg | Freq: Once | INTRAMUSCULAR | Status: AC
  Administered 2012-04-08: 1 mg via INTRAVENOUS
  Filled 2012-04-08: qty 1

## 2012-04-08 MED ORDER — KETOROLAC TROMETHAMINE 30 MG/ML IJ SOLN
30.0000 mg | Freq: Once | INTRAMUSCULAR | Status: AC
  Administered 2012-04-08: 30 mg via INTRAVENOUS
  Filled 2012-04-08: qty 1

## 2012-04-08 NOTE — ED Provider Notes (Signed)
History     CSN: [id number]  Arrival date & time 04/08/12  1014   First MD Initiated Contact with Patient 04/08/12 1034      Chief Complaint  Patient presents with  . Nausea  . Emesis    (Consider location/radiation/quality/duration/timing/severity/associated sxs/prior treatment) HPI Comments: [name] 21 y.o. female   The chief complaint is: Patient presents with:   Nausea   Emesis  The patient has medical history significant for:    Anxiety                                                      Previous cesarean delivery, antepartum conditi* 04/01/2011    History of kidney stones                                       Comment:x 4, first time age 30yo   History of UTI                                                 Comment:several   Anemia                                                         Comment:with pregnancy   Wears glasses                                                Chronic headaches                                              Comment:began late 2012   GERD (gastroesophageal reflux disease)                       Seasonal allergic rhinitis                                   Depression                                                     Comment:with prior parental divorce, resolved   Contraception, device intrauterine              04/2011         Comment:Mirena  The onset of the symptoms was  abrupt starting 3 hours ago,  The Course is  persistent, rapidly worsened everythin makes symptoms worse, nothing makes symptoms better Has associated nausea, vomiting, back pain Denies vaginal bleeding, vaginal  discharge, CP, SOB, diarrhea      The history is provided by the patient, a parent and medical records. History Limited By: uncooperative patient.    [name] is a 21 y.o. female who presents to the ER c/o abdominal pain, back pain and nausea/vomiting x 3 hrs.  She had dysuria, frequency and urgency yesterday.  She denies hematuria.  Pt reports a Hx  of UTI and kidney stones x3.  Last night she went to a party and consumed large amounts of alcohol, vomited numerous times and then fell asleep about midnight.  She states the abdominal pain woke her from her sleep and the vomiting followed the onset of pain.  Pain is described as sharp, rated at a 10/10, and radiates to her back.  She denies fever, diaphoresis, vaginal bleeding, vaginal discharge, diarrhea, SOB, CP.  She has a Mirena IUD, inserted last year and has irregular menses.  She has not tried to take anything for the pain or nausea.  Nothing makes the pain better and "everything" makes it worse.  She was given a fluid bolus and Zofran 4mg  IV by EMS enroute.    Past Medical History  Diagnosis Date  . Anxiety   . Previous cesarean delivery, antepartum condition or complication 04/01/2011  . History of kidney stones     x 4, first time age 23yo  . History of UTI     several  . Anemia     with pregnancy  . Wears glasses   . Chronic headaches     began late 2012  . GERD (gastroesophageal reflux disease)   . Seasonal allergic rhinitis   . Depression     with prior parental divorce, resolved  . Contraception, device intrauterine 04/2011    Mirena    Past Surgical History  Procedure Date  . Cesarean section   . Tonsillectomy     Family History  Problem Relation Age of Onset  . Hypertension Mother   . Cancer Mother 77    colon  . Diabetes Mother     borderline  . Thyroid disease Mother   . Depression Mother   . Anxiety disorder Mother   . Hypertension Maternal Grandfather   . Heart disease Maternal Grandfather   . Other Father     unknown  . Cancer Father     brain cancer?  . ADD / ADHD Sister   . Heart disease Maternal Uncle   . Diabetes Maternal Grandmother   . Stroke Neg Hx   . Cancer Other     others on maternal side    History  Substance Use Topics  . Smoking status: Current Everyday Smoker -- 0.5 packs/day for 7 years    Types: Cigarettes  . Smokeless  tobacco: Never Used  . Alcohol Use: Yes    OB History    Grav Para Term Preterm Abortions TAB SAB Ect Mult Living   2 1  1      1       Review of Systems  Constitutional: Negative for fever, diaphoresis, appetite change and fatigue.  Respiratory: Negative for cough, chest tightness, shortness of breath and wheezing.   Cardiovascular: Negative for chest pain.  Gastrointestinal: Positive for nausea, vomiting, abdominal pain and constipation. Negative for diarrhea and blood in stool.  Genitourinary: Positive for dysuria, urgency, frequency, flank pain and difficulty urinating (urinates on self). Negative for hematuria, vaginal bleeding, vaginal discharge and vaginal pain.  Musculoskeletal: Positive for back pain.  Skin: Negative  for rash.  Neurological: Negative for syncope, light-headedness and headaches.  Psychiatric/Behavioral: The patient is not nervous/anxious.     Allergies  Review of patient's allergies indicates no known allergies.  Home Medications   Current Outpatient Rx  Name Route Sig Dispense Refill  . CITALOPRAM HYDROBROMIDE 20 MG PO TABS Oral Take 1 tablet (20 mg total) by mouth daily. 1/2 tablet QHS x 1wk, then 1 tablet QHS 30 tablet 2  . IBUPROFEN 200 MG PO TABS Oral Take 600 mg by mouth every 6 (six) hours as needed. For pain    . LEVONORGESTREL 20 MCG/24HR IU IUD Intrauterine 1 each by Intrauterine route once.    . OMEPRAZOLE 40 MG PO CPDR Oral Take 1 capsule (40 mg total) by mouth daily. 1 capsule 30-45 minutes prior to breakfast 30 capsule 3    BP 117/72  Pulse 72  Temp 97.9 F (36.6 C) (Rectal)  Resp 20  SpO2 98%  Physical Exam  Nursing note and vitals reviewed. Constitutional: She appears well-developed and well-nourished.       Pt appears uncomfortable, thrashing in bed during exam   HENT:  Head: Normocephalic and atraumatic.  Mouth/Throat: Oropharynx is clear and moist. No oropharyngeal exudate.  Eyes: Conjunctivae are normal. No scleral icterus.   Neck: Normal range of motion. Neck supple.  Cardiovascular: Normal rate, regular rhythm, normal heart sounds and intact distal pulses.  Exam reveals no gallop and no friction rub.   No murmur heard. Pulmonary/Chest: Effort normal and breath sounds normal. No respiratory distress. She has no wheezes.  Abdominal: Soft. Normal appearance and bowel sounds are normal. She exhibits no distension and no mass. There is no hepatosplenomegaly. There is tenderness in the suprapubic area, left upper quadrant and left lower quadrant. There is guarding and CVA tenderness. There is no rigidity and no rebound.       L CVA tenderness  Genitourinary:       Pt urinates on herself during exam  Musculoskeletal: Normal range of motion. She exhibits no edema.  Lymphadenopathy:    She has no cervical adenopathy.  Neurological: She is alert. She exhibits normal muscle tone. Coordination normal.       Speech is clear and goal oriented Moves extremities without ataxia  Skin: Skin is warm and dry. No rash noted. She is not diaphoretic.  Psychiatric: She has a normal mood and affect.    ED Course  Procedures (including critical care time)  Results for orders placed during the hospital encounter of 04/08/12  CBC      Component Value Range   WBC 13.1 (*) 4.0 - 10.5 K/uL   RBC 4.81  3.87 - 5.11 MIL/uL   Hemoglobin 14.9  12.0 - 15.0 g/dL   HCT 84.6  96.2 - 95.2 %   MCV 88.1  78.0 - 100.0 fL   MCH 31.0  26.0 - 34.0 pg   MCHC 35.1  30.0 - 36.0 g/dL   RDW 84.1  32.4 - 40.1 %   Platelets 225  150 - 400 K/uL  BASIC METABOLIC PANEL      Component Value Range   Sodium 139  135 - 145 mEq/L   Potassium 3.1 (*) 3.5 - 5.1 mEq/L   Chloride 103  96 - 112 mEq/L   CO2 23  19 - 32 mEq/L   Glucose, Bld 93  70 - 99 mg/dL   BUN 11  6 - 23 mg/dL   Creatinine, Ser 0.27  0.50 - 1.10 mg/dL  Calcium 9.1  8.4 - 10.5 mg/dL   GFR calc non Af Amer >90  >90 mL/min   GFR calc Af Amer >90  >90 mL/min  URINALYSIS, ROUTINE W REFLEX  MICROSCOPIC      Component Value Range   Color, Urine AMBER (*) YELLOW   APPearance CLOUDY (*) CLEAR   Specific Gravity, Urine 1.029  1.005 - 1.030   pH 7.0  5.0 - 8.0   Glucose, UA NEGATIVE  NEGATIVE mg/dL   Hgb urine dipstick LARGE (*) NEGATIVE   Bilirubin Urine SMALL (*) NEGATIVE   Ketones, ur 40 (*) NEGATIVE mg/dL   Protein, ur 30 (*) NEGATIVE mg/dL   Urobilinogen, UA 1.0  0.0 - 1.0 mg/dL   Nitrite NEGATIVE  NEGATIVE   Leukocytes, UA SMALL (*) NEGATIVE  PREGNANCY, URINE      Component Value Range   Preg Test, Ur NEGATIVE  NEGATIVE  ETHANOL      Component Value Range   Alcohol, Ethyl (B) <11  0 - 11 mg/dL  URINE MICROSCOPIC-ADD ON      Component Value Range   Squamous Epithelial / LPF RARE  RARE   WBC, UA 0-2  <3 WBC/hpf   RBC / HPF TOO NUMEROUS TO COUNT  <3 RBC/hpf   Bacteria, UA FEW (*) RARE   Urine-Other LESS THAN 10 mL OF URINE SUBMITTED     Ct Abdomen Pelvis Wo Contrast  04/08/2012  *RADIOLOGY REPORT*  Clinical Data: Left flank pain and hematuria.  CT ABDOMEN AND PELVIS WITHOUT CONTRAST  Technique:  Multidetector CT imaging of the abdomen and pelvis was performed following the standard protocol without intravenous contrast.  Comparison: 03/18/2007  Findings: Lung bases are clear.  No evidence for free intraperitoneal air.  No gross abnormality to the liver, spleen, gallbladder or pancreas.  No gross abnormality to the adrenal tissue.  There is a 2 mm stone in the lower pole of the right kidney.  There is fullness of the left renal pelvis and edema around the left ureter. There are two or three small stones within the left kidney.  There is a 3 mm stone in the posterior bladder which appears to be medial to the left ureterovesical junction.  The patient has an IUD present.  No gross abnormality to the adnexa tissue.  No significant free fluid or lymphadenopathy.  No acute bony abnormality.  IMPRESSION: Mild left hydronephrosis with stranding around the left ureter. There is a 3 mm  stone within the bladder and findings most likely represent a recently passed stone.  Bilateral small kidney stones.  Original Report Authenticated By: Richarda Overlie, M.D.    1. Nausea & vomiting   2. Abdominal pain   3. Flank pain, acute   4. Nephrolithiasis    Pt remains in pain.  Since there is not a proximal stone on CT, I will give Toradol 30mg  for renal colic pain.  I will also give Zofran 4mg  repeat dose and have the patient attempt PO intake.  1:31 PM     MDM  Kennis Carina presented with renal colic and a Hx of kidney stones. Pt given pain control and fluids while in the ED.  Her UA is consistent with nephrolithiasis and she remains afebrile.  Her CT was positive for a 3mm stone in the bladder and bilateral small kidney stones.  She had significant improvement after treatment and has been able to keep down PO fluids.  I will discharge home with pain control and a  referral to urology.  I have discussed indications to return immediately and patient and family express understanding.   1. Medications: Norco 2. Treatment: oral hydration with water, rest 3. Follow Up: urology         Dierdre Forth, PA-C 04/08/12 1440

## 2012-04-08 NOTE — ED Provider Notes (Signed)
Medical screening examination/treatment/procedure(s) were conducted as a shared visit with non-physician practitioner(s) and myself.  I personally evaluated the patient during the encounter  Flint Melter, MD 04/08/12 2125

## 2012-04-08 NOTE — ED Notes (Signed)
Per EMS: Pt was drinking last night.  Woke up this morning w/ NV and pain in LLQ.  Increased pain on palpation.  20 g L hand. 4 mg zofran given en route.  Pt vomited some en route.  Hx anxiety.

## 2012-04-08 NOTE — ED Notes (Signed)
Pt states she has LLQ abdominal pain that radiates to her back.  Pt has been vomiting since she woke up at 0800.  Pt states she drank "a lot" last night.  Pt urinated on herself during interview.

## 2012-04-08 NOTE — ED Notes (Signed)
XBM:WU13<KG> Expected date:04/08/12<BR> Expected time:10:04 AM<BR> Means of arrival:Ambulance<BR> Comments:<BR> N/V

## 2012-07-24 ENCOUNTER — Telehealth: Payer: Self-pay | Admitting: Family Medicine

## 2012-07-24 NOTE — Telephone Encounter (Signed)
Pt called to check on status of getting re-est ov with Dr Clent Ridges. Pt has been informed that Dr Clent Ridges accepted pts req and pt has been sch ov to re-est as noted. Pt is self pay.

## 2012-07-24 NOTE — Telephone Encounter (Signed)
Pt was last seen 2009. Pt would like to re-est. Can I sch?

## 2012-07-24 NOTE — Telephone Encounter (Signed)
Yes that would be okay

## 2012-07-31 ENCOUNTER — Ambulatory Visit (INDEPENDENT_AMBULATORY_CARE_PROVIDER_SITE_OTHER): Payer: Self-pay | Admitting: Family Medicine

## 2012-07-31 ENCOUNTER — Encounter: Payer: Self-pay | Admitting: Family Medicine

## 2012-07-31 VITALS — BP 108/64 | HR 52 | Temp 98.2°F | Resp 16 | Wt 128.0 lb

## 2012-07-31 DIAGNOSIS — F419 Anxiety disorder, unspecified: Secondary | ICD-10-CM

## 2012-07-31 DIAGNOSIS — F329 Major depressive disorder, single episode, unspecified: Secondary | ICD-10-CM

## 2012-07-31 DIAGNOSIS — F411 Generalized anxiety disorder: Secondary | ICD-10-CM

## 2012-07-31 MED ORDER — VENLAFAXINE HCL ER 75 MG PO CP24: 75.0000 mg | ORAL_CAPSULE | Freq: Every day | ORAL | Status: DC

## 2012-08-01 ENCOUNTER — Encounter: Payer: Self-pay | Admitting: Family Medicine

## 2012-08-01 DIAGNOSIS — F329 Major depressive disorder, single episode, unspecified: Secondary | ICD-10-CM | POA: Insufficient documentation

## 2012-08-01 NOTE — Progress Notes (Signed)
  Subjective:    Patient ID: [name], female    DOB: 09-04-1991, 21 y.o.   MRN: [id number]  HPI 21 yr old female to re-establish after an absence of 4 years. She has had two children since I last saw her, now aged 1 year and 3 years. She is engaged. She has been seeing Dr. Chilton Si at [facility] for GYN care. 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 She has dealt with anxiety and depression for years, and she took Lorazepam for a time as a teenager. Then earlier this year she was started on Celexa 20 mg a day and she took this for about 3 months. She did not find it to be helpful so she stopped it about one month ago. She describes feeling stressed all the time, and she gets very irritable. She has a short temper with her children ans she gets tearful quickly. She has trouble sleeping. She says her fiance is very supportive however.    Review of Systems  Constitutional: Negative.   Psychiatric/Behavioral: Positive for sleep disturbance, dysphoric mood and decreased concentration. Negative for suicidal ideas, hallucinations, behavioral problems, confusion, self-injury and agitation. The patient is nervous/anxious. The patient is not hyperactive.        Objective:   Physical Exam  Constitutional: She is oriented to person, place, and time. She appears well-developed and well-nourished.  Neck: No thyromegaly present.  Cardiovascular: Normal rate, regular rhythm, normal heart sounds and intact distal pulses.   Pulmonary/Chest: Effort normal and breath sounds normal.  Lymphadenopathy:    She has no cervical adenopathy.  Neurological: She is alert and oriented to person, place, and time.  Psychiatric: She has a normal mood and affect. Her behavior is normal. Judgment and thought content normal.          Assessment & Plan:  She has a combination of depression and anxiety. Try Effexor XR 75 mg daily. Recheck in 3-4 weeks. We will adjust  the dose prn

## 2012-09-20 ENCOUNTER — Emergency Department (HOSPITAL_COMMUNITY)
Admission: EM | Admit: 2012-09-20 | Discharge: 2012-09-20 | Disposition: A | Discharge status: Home / Self Care | Payer: Self-pay | Attending: Emergency Medicine | Admitting: Emergency Medicine

## 2012-09-20 ENCOUNTER — Encounter (HOSPITAL_COMMUNITY): Payer: Self-pay | Admitting: *Deleted

## 2012-09-20 DIAGNOSIS — Z8709 Personal history of other diseases of the respiratory system: Secondary | ICD-10-CM | POA: Insufficient documentation

## 2012-09-20 DIAGNOSIS — R112 Nausea with vomiting, unspecified: Secondary | ICD-10-CM | POA: Insufficient documentation

## 2012-09-20 DIAGNOSIS — F411 Generalized anxiety disorder: Secondary | ICD-10-CM | POA: Insufficient documentation

## 2012-09-20 DIAGNOSIS — F3289 Other specified depressive episodes: Secondary | ICD-10-CM | POA: Insufficient documentation

## 2012-09-20 DIAGNOSIS — R063 Periodic breathing: Secondary | ICD-10-CM | POA: Insufficient documentation

## 2012-09-20 DIAGNOSIS — F17200 Nicotine dependence, unspecified, uncomplicated: Secondary | ICD-10-CM | POA: Insufficient documentation

## 2012-09-20 DIAGNOSIS — Z3202 Encounter for pregnancy test, result negative: Secondary | ICD-10-CM | POA: Insufficient documentation

## 2012-09-20 DIAGNOSIS — Z79899 Other long term (current) drug therapy: Secondary | ICD-10-CM | POA: Insufficient documentation

## 2012-09-20 DIAGNOSIS — N390 Urinary tract infection, site not specified: Secondary | ICD-10-CM | POA: Insufficient documentation

## 2012-09-20 DIAGNOSIS — Z87442 Personal history of urinary calculi: Secondary | ICD-10-CM | POA: Insufficient documentation

## 2012-09-20 DIAGNOSIS — F329 Major depressive disorder, single episode, unspecified: Secondary | ICD-10-CM | POA: Insufficient documentation

## 2012-09-20 DIAGNOSIS — R197 Diarrhea, unspecified: Secondary | ICD-10-CM | POA: Insufficient documentation

## 2012-09-20 DIAGNOSIS — Z862 Personal history of diseases of the blood and blood-forming organs and certain disorders involving the immune mechanism: Secondary | ICD-10-CM | POA: Insufficient documentation

## 2012-09-20 DIAGNOSIS — K219 Gastro-esophageal reflux disease without esophagitis: Secondary | ICD-10-CM | POA: Insufficient documentation

## 2012-09-20 DIAGNOSIS — R61 Generalized hyperhidrosis: Secondary | ICD-10-CM | POA: Insufficient documentation

## 2012-09-20 LAB — URINALYSIS, ROUTINE W REFLEX MICROSCOPIC
Protein, ur: NEGATIVE mg/dL
Urobilinogen, UA: 0.2 mg/dL (ref 0.0–1.0)

## 2012-09-20 LAB — URINE MICROSCOPIC-ADD ON

## 2012-09-20 LAB — CBC WITH DIFFERENTIAL/PLATELET
Basophils Relative: 0 % (ref 0–1)
Eosinophils Absolute: 0.4 10*3/uL (ref 0.0–0.7)
Hemoglobin: 15.9 g/dL — ABNORMAL HIGH (ref 12.0–15.0)
MCH: 31.1 pg (ref 26.0–34.0)
MCHC: 34.6 g/dL (ref 30.0–36.0)
Monocytes Relative: 4 % (ref 3–12)
Neutrophils Relative %: 90 % — ABNORMAL HIGH (ref 43–77)
RDW: 12.4 % (ref 11.5–15.5)
WBC: 17.7 10*3/uL — ABNORMAL HIGH (ref 4.0–10.5)

## 2012-09-20 LAB — POCT I-STAT, CHEM 8
Creatinine, Ser: 0.8 mg/dL (ref 0.50–1.10)
Glucose, Bld: 104 mg/dL — ABNORMAL HIGH (ref 70–99)
Hemoglobin: 16.7 g/dL — ABNORMAL HIGH (ref 12.0–15.0)
Sodium: 141 mEq/L (ref 135–145)
TCO2: 21 mmol/L (ref 0–100)

## 2012-09-20 MED ORDER — PROMETHAZINE HCL 12.5 MG PO TABS: 12.5000 mg | ORAL_TABLET | Freq: Four times a day (QID) | ORAL | Status: DC | PRN

## 2012-09-20 MED ORDER — CEPHALEXIN 250 MG PO CAPS
250.0000 mg | ORAL_CAPSULE | Freq: Once | ORAL | Status: AC
  Administered 2012-09-20: 250 mg via ORAL
  Filled 2012-09-20: qty 1

## 2012-09-20 MED ORDER — CEPHALEXIN 500 MG PO CAPS: 500.0000 mg | ORAL_CAPSULE | Freq: Four times a day (QID) | ORAL | Status: DC

## 2012-09-20 MED ORDER — ONDANSETRON 8 MG PO TBDP
8.0000 mg | ORAL_TABLET | Freq: Once | ORAL | Status: AC
  Administered 2012-09-20: 8 mg via ORAL
  Filled 2012-09-20: qty 1

## 2012-09-20 MED ORDER — PROMETHAZINE HCL 25 MG PO TABS
25.0000 mg | ORAL_TABLET | Freq: Once | ORAL | Status: AC
  Administered 2012-09-20: 25 mg via ORAL
  Filled 2012-09-20: qty 1

## 2012-09-20 NOTE — ED Notes (Addendum)
Pt in c/o n/v/d and abd cramping, states symptoms started this am, unsure of fever but c/o chills and weakness. Pt also unsure if she is pregnant, but has IUD.

## 2012-09-20 NOTE — ED Provider Notes (Signed)
History  Scribed for Hannah Crumble, PA-C/Hannah Rachel Moulds, MD, the patient was seen in room WTR7/WTR7. This chart was scribed by [name]. The patient's care started at 5:34 PM   CSN: [id number]  Arrival date & time 09/20/12  1722   First MD Initiated Contact with Patient 09/20/12 1733      Chief Complaint  Patient presents with  . N/V/D     The history is provided by the patient. No language interpreter was used.   [name] is a 22 y.o. female who presents to the Emergency Department complaining of nausea, vomiting, and diarrhea that started yesterday.  She is also experiencing chills and sweats.  She has vomited about ten times today.  Pt describes her diarrhea as watery.  She has taken nothing for the sx.  Pt has an IUD in place and reports she is unsure if pregnant.      Past Medical History  Diagnosis Date  . Anxiety   . Previous cesarean delivery, antepartum condition or complication 04/01/2011  . History of kidney stones     x 4, first time age 53yo  . Anemia     with pregnancy  . Chronic headaches     began late 2012  . GERD (gastroesophageal reflux disease)   . Seasonal allergic rhinitis   . Depression     Past Surgical History  Procedure Date  . Cesarean section   . Tonsillectomy     Family History  Problem Relation Age of Onset  . Hypertension Mother   . Cancer Mother 65    colon  . Diabetes Mother     borderline  . Thyroid disease Mother   . Depression Mother   . Anxiety disorder Mother   . Hypertension Maternal Grandfather   . Heart disease Maternal Grandfather   . Other Father     unknown  . Cancer Father     brain cancer?  . ADD / ADHD Sister   . Heart disease Maternal Uncle   . Diabetes Maternal Grandmother   . Stroke Neg Hx   . Cancer Other     others on maternal side    History  Substance Use Topics  . Smoking status: Current Every Day Smoker -- 0.5 packs/day for 7 years    Types: Cigarettes  . Smokeless tobacco: Never  Used  . Alcohol Use: Yes    OB History    Grav Para Term Preterm Abortions TAB SAB Ect Mult Living   2 1  1      1       Review of Systems  Constitutional: Positive for chills and diaphoresis. Negative for fever.  Respiratory: Negative for cough and shortness of breath.   Gastrointestinal: Positive for nausea, vomiting and diarrhea. Negative for abdominal pain.  Genitourinary: Negative for vaginal bleeding and vaginal discharge.  Neurological: Negative for weakness.  All other systems reviewed and are negative.    Allergies  Review of patient's allergies indicates no known allergies.  Home Medications   Current Outpatient Rx  Name  Route  Sig  Dispense  Refill  . IBUPROFEN 200 MG PO TABS   Oral   Take 600 mg by mouth every 6 (six) hours as needed. For pain         . LEVONORGESTREL 20 MCG/24HR IU IUD   Intrauterine   1 each by Intrauterine route once.         . VENLAFAXINE HCL ER 75 MG PO CP24  Oral   Take 1 capsule (75 mg total) by mouth daily.   30 capsule   2     BP 106/65  Pulse 102  Temp 98.3 F (36.8 C) (Oral)  Resp 18  SpO2 98%  Physical Exam  Nursing note and vitals reviewed. Constitutional: She is oriented to person, place, and time. She appears well-developed and well-nourished. No distress.  HENT:  Head: Normocephalic and atraumatic.  Right Ear: Tympanic membrane normal.  Left Ear: Tympanic membrane normal.  Mouth/Throat: Oropharynx is clear and moist. No oropharyngeal exudate.       Blister under nasal septum.    Eyes: EOM are normal.  Neck: Normal range of motion. Neck supple. No tracheal deviation present.  Cardiovascular: Normal rate and regular rhythm.   Pulmonary/Chest: Effort normal. No respiratory distress. She has no wheezes. She has no rales.  Abdominal: There is tenderness (LLQ tenderness). There is no guarding.  Musculoskeletal: Normal range of motion.  Neurological: She is alert and oriented to person, place, and time.    Skin: Skin is warm and dry.  Psychiatric: She has a normal mood and affect. Her behavior is normal.    ED Course  Procedures   DIAGNOSTIC STUDIES: Oxygen Saturation is 98% on room air, normal by my interpretation.    COORDINATION OF CARE:  5:40 PM Will check electrolytes and blood work.  Will give nausea medication and rehydrate pt.   Pt understands and agree.    5:42 PM Ordered: CBC with Differential; Urinalysis, Routine w relfex microscopic; I-Stat, Chem 8; POCT Pregnancy, Urine (Not at [facility])  5:45 PM Ordered: ondansetron (ZOFRAN-ODT) disintegrating tablet 8 mg  6:03 PM Recheck: Pt reports her nausea has improved after Zofran.  Will give water.    7:05 PM Recheck: Pt feeling better.  Will discharge. Pt understands and agrees.   Labs Reviewed  CBC WITH DIFFERENTIAL - Abnormal; Notable for the following:    WBC 17.7 (*)     Hemoglobin 15.9 (*)     Neutrophils Relative 90 (*)     Neutro Abs 15.9 (*)     Lymphocytes Relative 4 (*)     All other components within normal limits  URINALYSIS, ROUTINE W REFLEX MICROSCOPIC - Abnormal; Notable for the following:    Color, Urine AMBER (*)  BIOCHEMICALS MAY BE AFFECTED BY COLOR   APPearance TURBID (*)     Hgb urine dipstick SMALL (*)     Bilirubin Urine SMALL (*)     Ketones, ur 15 (*)     Leukocytes, UA MODERATE (*)     All other components within normal limits  POCT I-STAT, CHEM 8 - Abnormal; Notable for the following:    Glucose, Bld 104 (*)     Hemoglobin 16.7 (*)     HCT 49.0 (*)     All other components within normal limits  URINE MICROSCOPIC-ADD ON - Abnormal; Notable for the following:    Squamous Epithelial / LPF MANY (*)     Bacteria, UA MANY (*)     All other components within normal limits  POCT PREGNANCY, URINE   Pt's labs show elevated WBC, UA contaminated, but will treat given many bacteria and WBCs. Pt denies vaginal complaints and does not want a pelvic exam. Pt currently tolerating POs, after receiving 8mg  of  zofran ODT.   1. Nausea vomiting and diarrhea   2. UTI (lower urinary tract infection)       MDM  PT with n/v/d onset today. States  unable to keep anything down. Pt was given zofran ODT. Her nausea resolved, she is not vomiting in ED, she is drinking fluids. Her UA shows infection, although contaminated, will start on keflex for UTI. ehr electrolytes are normal. WBC is elevated, but so is hgb suspect partially due to dehydration and partially to her illness. Her abdomen soft, mild tenderness in LLQ, however, no guarding, no rebound tenderness. Pt's vitals are normal. She is non toxic appearing. Will d/c home with anti emetics, keflex, follow up.    I personally performed the services described in this documentation, which was scribed in my presence. The recorded information has been reviewed and is accurate.        Lottie Mussel, PA 09/21/12 [phone]

## 2012-09-21 NOTE — ED Provider Notes (Signed)
Medical screening examination/treatment/procedure(s) were performed by non-physician practitioner and as supervising physician I was immediately available for consultation/collaboration.   Abram Sax L Sahiti Joswick, MD 09/21/12 0154 

## 2012-09-22 LAB — URINE CULTURE: Colony Count: NO GROWTH

## 2012-11-19 ENCOUNTER — Telehealth: Payer: Self-pay | Admitting: Family Medicine

## 2012-11-19 NOTE — Telephone Encounter (Signed)
Refill request for Venlafaxine XR 75 mg take 1 po qd

## 2012-11-19 NOTE — Telephone Encounter (Signed)
Refill for one year 

## 2012-11-20 MED ORDER — VENLAFAXINE HCL ER 75 MG PO CP24: 75.0000 mg | ORAL_CAPSULE | Freq: Every day | ORAL | Status: DC

## 2012-11-20 NOTE — Telephone Encounter (Signed)
I sent script e-scribe. 

## 2012-12-24 ENCOUNTER — Ambulatory Visit: Payer: Self-pay | Admitting: Family Medicine

## 2012-12-24 DIAGNOSIS — Z0289 Encounter for other administrative examinations: Secondary | ICD-10-CM

## 2013-01-23 ENCOUNTER — Emergency Department (HOSPITAL_COMMUNITY): Payer: Self-pay

## 2013-01-23 ENCOUNTER — Emergency Department (HOSPITAL_COMMUNITY)
Admission: EM | Admit: 2013-01-23 | Discharge: 2013-01-23 | Disposition: A | Discharge status: Home / Self Care | Payer: Self-pay | Attending: Emergency Medicine | Admitting: Emergency Medicine

## 2013-01-23 ENCOUNTER — Encounter (HOSPITAL_COMMUNITY): Payer: Self-pay | Admitting: Emergency Medicine

## 2013-01-23 DIAGNOSIS — F3289 Other specified depressive episodes: Secondary | ICD-10-CM | POA: Insufficient documentation

## 2013-01-23 DIAGNOSIS — R112 Nausea with vomiting, unspecified: Secondary | ICD-10-CM | POA: Insufficient documentation

## 2013-01-23 DIAGNOSIS — Z8719 Personal history of other diseases of the digestive system: Secondary | ICD-10-CM | POA: Insufficient documentation

## 2013-01-23 DIAGNOSIS — F411 Generalized anxiety disorder: Secondary | ICD-10-CM | POA: Insufficient documentation

## 2013-01-23 DIAGNOSIS — Z862 Personal history of diseases of the blood and blood-forming organs and certain disorders involving the immune mechanism: Secondary | ICD-10-CM | POA: Insufficient documentation

## 2013-01-23 DIAGNOSIS — Z3202 Encounter for pregnancy test, result negative: Secondary | ICD-10-CM | POA: Insufficient documentation

## 2013-01-23 DIAGNOSIS — G8929 Other chronic pain: Secondary | ICD-10-CM | POA: Insufficient documentation

## 2013-01-23 DIAGNOSIS — Z79899 Other long term (current) drug therapy: Secondary | ICD-10-CM | POA: Insufficient documentation

## 2013-01-23 DIAGNOSIS — F329 Major depressive disorder, single episode, unspecified: Secondary | ICD-10-CM | POA: Insufficient documentation

## 2013-01-23 DIAGNOSIS — Z87442 Personal history of urinary calculi: Secondary | ICD-10-CM | POA: Insufficient documentation

## 2013-01-23 DIAGNOSIS — F17200 Nicotine dependence, unspecified, uncomplicated: Secondary | ICD-10-CM | POA: Insufficient documentation

## 2013-01-23 DIAGNOSIS — R1084 Generalized abdominal pain: Secondary | ICD-10-CM | POA: Insufficient documentation

## 2013-01-23 DIAGNOSIS — Z9889 Other specified postprocedural states: Secondary | ICD-10-CM | POA: Insufficient documentation

## 2013-01-23 DIAGNOSIS — R197 Diarrhea, unspecified: Secondary | ICD-10-CM | POA: Insufficient documentation

## 2013-01-23 DIAGNOSIS — Z8669 Personal history of other diseases of the nervous system and sense organs: Secondary | ICD-10-CM | POA: Insufficient documentation

## 2013-01-23 LAB — COMPREHENSIVE METABOLIC PANEL
AST: 17 U/L (ref 0–37)
CO2: 28 mEq/L (ref 19–32)
Calcium: 9.9 mg/dL (ref 8.4–10.5)
Chloride: 100 mEq/L (ref 96–112)
Creatinine, Ser: 0.75 mg/dL (ref 0.50–1.10)
GFR calc Af Amer: >90 mL/min (ref >90)
GFR calc non Af Amer: >90 mL/min (ref >90)
Glucose, Bld: 131 mg/dL — ABNORMAL HIGH (ref 70–99)
Total Bilirubin: 0.3 mg/dL (ref 0.3–1.2)

## 2013-01-23 LAB — CBC WITH DIFFERENTIAL/PLATELET
Basophils Relative: 0 % (ref 0–1)
HCT: 47 % — ABNORMAL HIGH (ref 36.0–46.0)
Hemoglobin: 16.7 g/dL — ABNORMAL HIGH (ref 12.0–15.0)
Lymphs Abs: 0.8 10*3/uL (ref 0.7–4.0)
MCH: 31.2 pg (ref 26.0–34.0)
MCHC: 35.5 g/dL (ref 30.0–36.0)
Monocytes Absolute: 0.4 10*3/uL (ref 0.1–1.0)
Monocytes Relative: 4 % (ref 3–12)
Neutro Abs: 10 10*3/uL — ABNORMAL HIGH (ref 1.7–7.7)

## 2013-01-23 LAB — URINALYSIS, ROUTINE W REFLEX MICROSCOPIC
Glucose, UA: NEGATIVE mg/dL
Ketones, ur: 40 mg/dL — AB
Protein, ur: 100 mg/dL — AB

## 2013-01-23 LAB — URINE MICROSCOPIC-ADD ON

## 2013-01-23 MED ORDER — SODIUM CHLORIDE 0.9 % IV BOLUS (SEPSIS)
1000.0000 mL | Freq: Once | INTRAVENOUS | Status: AC
  Administered 2013-01-23: 1000 mL via INTRAVENOUS

## 2013-01-23 MED ORDER — ONDANSETRON HCL 4 MG PO TABS: 4.0000 mg | ORAL_TABLET | Freq: Three times a day (TID) | ORAL | Status: DC | PRN

## 2013-01-23 MED ORDER — SODIUM CHLORIDE 0.9 % IV SOLN
INTRAVENOUS | Status: DC
  Administered 2013-01-23: 14:00:00 via INTRAVENOUS

## 2013-01-23 MED ORDER — FAMOTIDINE IN NACL 20-0.9 MG/50ML-% IV SOLN
20.0000 mg | Freq: Once | INTRAVENOUS | Status: AC
  Administered 2013-01-23: 20 mg via INTRAVENOUS
  Filled 2013-01-23: qty 50

## 2013-01-23 MED ORDER — ONDANSETRON HCL 4 MG/2ML IJ SOLN
4.0000 mg | INTRAMUSCULAR | Status: DC | PRN
  Administered 2013-01-23: 4 mg via INTRAVENOUS
  Filled 2013-01-23: qty 2

## 2013-01-23 NOTE — ED Notes (Signed)
Pt c/o N/V/D x2-3 days. Pt states "I woke up a couple mornings ago and just started throwing up and having diarrhea". Pt has not been able to keep "most foods and liquids" down. Pt does reports "mild" abdominal pain. Pt describes pain as cramping. Pt states "now all I'm throwing up is stomach bile". Pt given 8mg  of Zofran pta via EMS.

## 2013-01-23 NOTE — ED Provider Notes (Signed)
History     CSN: [id number]  Arrival date & time 01/23/13  1140   First MD Initiated Contact with Patient 01/23/13 1151      Chief Complaint  Patient presents with  . Emesis  . Diarrhea     HPI Pt was seen at 1150.  Per pt, c/o gradual onset and persistence of multiple intermittent episodes of N/V/D that began 2 to 3 days ago.   Describes the stools as "watery."  Has been associated with generalized "cramping" abd pain. Denies CP/SOB, no back pain, no fevers, no black or blood in stools or emesis.     Past Medical History  Diagnosis Date  . Anxiety   . Previous cesarean delivery, antepartum condition or complication 04/01/2011  . History of kidney stones     x 4, first time age 21yo  . Anemia     with pregnancy  . Chronic headaches     began late 2012  . GERD (gastroesophageal reflux disease)   . Seasonal allergic rhinitis   . Depression     Past Surgical History  Procedure Laterality Date  . Cesarean section    . Tonsillectomy      Family History  Problem Relation Age of Onset  . Hypertension Mother   . Cancer Mother 21    colon  . Diabetes Mother     borderline  . Thyroid disease Mother   . Depression Mother   . Anxiety disorder Mother   . Hypertension Maternal Grandfather   . Heart disease Maternal Grandfather   . Other Father     unknown  . Cancer Father     brain cancer?  . ADD / ADHD Sister   . Heart disease Maternal Uncle   . Diabetes Maternal Grandmother   . Stroke Neg Hx   . Cancer Other     others on maternal side    History  Substance Use Topics  . Smoking status: Current Every Day Smoker -- 0.50 packs/day for 7 years    Types: Cigarettes  . Smokeless tobacco: Never Used  . Alcohol Use: Yes    OB History   Grav Para Term Preterm Abortions TAB SAB Ect Mult Living   2 1  1      1       Review of Systems ROS: Statement: All systems negative except as marked or noted in the HPI; Constitutional: Negative for fever and chills. ; ;  Eyes: Negative for eye pain, redness and discharge. ; ; ENMT: Negative for ear pain, hoarseness, nasal congestion, sinus pressure and sore throat. ; ; Cardiovascular: Negative for chest pain, palpitations, diaphoresis, dyspnea and peripheral edema. ; ; Respiratory: Negative for cough, wheezing and stridor. ; ; Gastrointestinal: +N/V/D, abd pain. Negative for blood in stool, hematemesis, jaundice and rectal bleeding. . ; ; Genitourinary: Negative for dysuria, flank pain and hematuria. ; ; Musculoskeletal: Negative for back pain and neck pain. Negative for swelling and trauma.; ; Skin: Negative for pruritus, rash, abrasions, blisters, bruising and skin lesion.; ; Neuro: Negative for headache, lightheadedness and neck stiffness. Negative for weakness, altered level of consciousness , altered mental status, extremity weakness, paresthesias, involuntary movement, seizure and syncope.       Allergies  Review of patient's allergies indicates no known allergies.  Home Medications   Current Outpatient Rx  Name  Route  Sig  Dispense  Refill  . levonorgestrel (MIRENA) 20 MCG/24HR IUD   Intrauterine   1 each by Intrauterine route once.         Marland Kitchen  venlafaxine XR (EFFEXOR XR) 75 MG 24 hr capsule   Oral   Take 1 capsule (75 mg total) by mouth daily.   30 capsule   11     BP 102/60  Pulse 81  Temp(Src) 98 F (36.7 C) (Oral)  Resp 16  SpO2 95%  Physical Exam 1155: Physical examination:  Nursing notes reviewed; Vital signs and O2 SAT reviewed;  Constitutional: Well developed, Well nourished, In no acute distress; Head:  Normocephalic, atraumatic; Eyes: EOMI, PERRL, No scleral icterus; ENMT: Mouth and pharynx normal, Mucous membranes dry; Neck: Supple, Full range of motion, No lymphadenopathy; Cardiovascular: Regular rate and rhythm, No murmur, rub, or gallop; Respiratory: Breath sounds clear & equal bilaterally, No rales, rhonchi, wheezes.  Speaking full sentences with ease, Normal respiratory  effort/excursion; Chest: Nontender, Movement normal; Abdomen: Soft, +mild diffuse tenderness to palp. No rebound or guarding. Nondistended, Normal bowel sounds; Genitourinary: No CVA tenderness; Extremities: Pulses normal, No tenderness, No edema, No calf edema or asymmetry.; Neuro: AA&Ox3, Major CN grossly intact.  Speech clear. No gross focal motor or sensory deficits in extremities.; Skin: Color normal, Warm, Dry.   ED Course  Procedures    MDM  MDM Reviewed: previous chart, nursing note and vitals Reviewed previous: labs Interpretation: labs and x-ray   Results for orders placed during the hospital encounter of 01/23/13  URINALYSIS, ROUTINE W REFLEX MICROSCOPIC      Result Value Range   Color, Urine YELLOW  YELLOW   APPearance CLEAR  CLEAR   Specific Gravity, Urine 1.020  1.005 - 1.030   pH 6.5  5.0 - 8.0   Glucose, UA NEGATIVE  NEGATIVE mg/dL   Hgb urine dipstick SMALL (*) NEGATIVE   Bilirubin Urine SMALL (*) NEGATIVE   Ketones, ur 40 (*) NEGATIVE mg/dL   Protein, ur 034 (*) NEGATIVE mg/dL   Urobilinogen, UA 1.0  0.0 - 1.0 mg/dL   Nitrite NEGATIVE  NEGATIVE   Leukocytes, UA TRACE (*) NEGATIVE  PREGNANCY, URINE      Result Value Range   Preg Test, Ur NEGATIVE  NEGATIVE  CBC WITH DIFFERENTIAL      Result Value Range   WBC 11.2 (*) 4.0 - 10.5 K/uL   RBC 5.35 (*) 3.87 - 5.11 MIL/uL   Hemoglobin 16.7 (*) 12.0 - 15.0 g/dL   HCT 74.2 (*) 59.5 - 63.8 %   MCV 87.9  78.0 - 100.0 fL   MCH 31.2  26.0 - 34.0 pg   MCHC 35.5  30.0 - 36.0 g/dL   RDW 75.6  43.3 - 29.5 %   Platelets 262  150 - 400 K/uL   Neutrophils Relative % 89 (*) 43 - 77 %   Neutro Abs 10.0 (*) 1.7 - 7.7 K/uL   Lymphocytes Relative 7 (*) 12 - 46 %   Lymphs Abs 0.8  0.7 - 4.0 K/uL   Monocytes Relative 4  3 - 12 %   Monocytes Absolute 0.4  0.1 - 1.0 K/uL   Eosinophils Relative 0  0 - 5 %   Eosinophils Absolute 0.0  0.0 - 0.7 K/uL   Basophils Relative 0  0 - 1 %   Basophils Absolute 0.0  0.0 - 0.1 K/uL   COMPREHENSIVE METABOLIC PANEL      Result Value Range   Sodium 138  135 - 145 mEq/L   Potassium 4.1  3.5 - 5.1 mEq/L   Chloride 100  96 - 112 mEq/L   CO2 28  19 - 32 mEq/L  Glucose, Bld 131 (*) 70 - 99 mg/dL   BUN 16  6 - 23 mg/dL   Creatinine, Ser 1.61  0.50 - 1.10 mg/dL   Calcium 9.9  8.4 - 09.6 mg/dL   Total Protein 8.4 (*) 6.0 - 8.3 g/dL   Albumin 4.5  3.5 - 5.2 g/dL   AST 17  0 - 37 U/L   ALT 14  0 - 35 U/L   Alkaline Phosphatase 92  39 - 117 U/L   Total Bilirubin 0.3  0.3 - 1.2 mg/dL   GFR calc non Af Amer >90  >90 mL/min   GFR calc Af Amer >90  >90 mL/min  LIPASE, BLOOD      Result Value Range   Lipase 17  11 - 59 U/L  URINE MICROSCOPIC-ADD ON      Result Value Range   Squamous Epithelial / LPF FEW (*) RARE   WBC, UA 0-2  <3 WBC/hpf   RBC / HPF 3-6  <3 RBC/hpf   Bacteria, UA FEW (*) RARE   Dg Abd Acute W/chest 01/23/2013   *RADIOLOGY REPORT*  Clinical Data: Emesis and diarrhea.  ACUTE ABDOMEN SERIES (ABDOMEN 2 VIEW & CHEST 1 VIEW)  Comparison: CT 04/08/2012  Findings: There is a chronic round density in the left upper chest which is overlying the anterior left first rib.  This is unchanged since 02/29/2009 could represent a sclerotic bone density or a calcified granuloma.  Otherwise, the lungs are clear.  Heart and mediastinum are within normal limits.  There is no evidence for free air.  There is an IUD in the pelvis.  There is a 3 mm calcification overlying the right kidney lower pole shadow.  This may represent a stone.  In general, there is very little bowel gas in the abdomen or pelvis.  There is an air-fluid level in the stomach.  IMPRESSION: No acute chest abnormalities.  Possible small right kidney stone.  Paucity of bowel gas which is nonspecific.   Original Report Authenticated By: Richarda Overlie, M.D.     1600:  Pt has tol PO well while in the ED without N/V. No stooling while in the ED.  Abd benign, VSS. Feels better and wants to go home now. Dx and testing d/w pt.   Questions answered.  Verb understanding, agreeable to d/c home with outpt f/u.          Laray Anger, DO 01/26/13 1105

## 2013-02-20 ENCOUNTER — Ambulatory Visit (INDEPENDENT_AMBULATORY_CARE_PROVIDER_SITE_OTHER): Payer: PRIVATE HEALTH INSURANCE | Admitting: Family Medicine

## 2013-02-20 ENCOUNTER — Encounter: Payer: Self-pay | Admitting: Family Medicine

## 2013-02-20 VITALS — BP 106/58 | HR 64 | Temp 98.4°F | Wt 123.0 lb

## 2013-02-20 DIAGNOSIS — F329 Major depressive disorder, single episode, unspecified: Secondary | ICD-10-CM

## 2013-02-20 DIAGNOSIS — F419 Anxiety disorder, unspecified: Secondary | ICD-10-CM

## 2013-02-20 DIAGNOSIS — F411 Generalized anxiety disorder: Secondary | ICD-10-CM

## 2013-02-20 MED ORDER — LAMOTRIGINE 25 MG PO TABS: 50.0000 mg | ORAL_TABLET | Freq: Every day | ORAL | Status: DC

## 2013-02-20 NOTE — Progress Notes (Signed)
  Subjective:    Patient ID: [name], female    DOB: 1991/08/06, 22 y.o.   MRN: [id number]  HPI Here to discuss her moods and her focus. Her mother was in earlier today and she told me about severe mood swings in [place] from day to day. She can be very high one day and very low the next. She can be very irritable and often loses patience with her 2 young children. [name] admits to me that she gets irritable and feels anxious at times. She denies much in the way of depression. She has tried Celexa and Effexor, but both of these made her feel flat emotionally and she had no energy. She has had trouble sleeping, and she has had some benefit from taking a couple of her mother's Xanax at night. She has totally stopped drinking alcohol and using marijuana. She is trying to quit smoking cigarettes. She also mentions trouble focusing and concentrating. Her mother and her sister have been treated for ADHD for years and they do quite well on Adderall. She is currently living with her mother and step-father.    Review of Systems  Constitutional: Negative.   Neurological: Negative.   Psychiatric/Behavioral: Positive for sleep disturbance, dysphoric mood, decreased concentration and agitation. Negative for hallucinations, behavioral problems and confusion. The patient is nervous/anxious. The patient is not hyperactive.        Objective:   Physical Exam  Constitutional: She is oriented to person, place, and time. She appears well-developed and well-nourished.  Neurological: She is alert and oriented to person, place, and time.  Psychiatric: She has a normal mood and affect. Her behavior is normal. Judgment and thought content normal.          Assessment & Plan:  Try Lamictal for mood swings and to help with sleep. Start with taking 25 mg qhs for one week and then go to 50 mg qhs. See me in 2 weeks. At some point we may consider treating her with ADHD meds as well but we will take it one step at a  time.

## 2013-03-22 ENCOUNTER — Other Ambulatory Visit (HOSPITAL_COMMUNITY)
Admission: RE | Admit: 2013-03-22 | Discharge: 2013-03-22 | Disposition: A | Discharge status: Home / Self Care | Payer: 59 | Source: Ambulatory Visit | Attending: Obstetrics and Gynecology | Admitting: Obstetrics and Gynecology

## 2013-03-22 ENCOUNTER — Other Ambulatory Visit: Payer: Self-pay | Admitting: Obstetrics and Gynecology

## 2013-03-22 DIAGNOSIS — Z01419 Encounter for gynecological examination (general) (routine) without abnormal findings: Secondary | ICD-10-CM | POA: Insufficient documentation

## 2013-04-15 ENCOUNTER — Telehealth: Payer: Self-pay | Admitting: Family Medicine

## 2013-04-15 NOTE — Telephone Encounter (Signed)
Refill request for Lamotrigine 25 mg take 2 po qd.

## 2013-04-16 MED ORDER — LAMOTRIGINE 25 MG PO TABS: 50.0000 mg | ORAL_TABLET | Freq: Every day | ORAL | Status: DC

## 2013-04-16 NOTE — Telephone Encounter (Signed)
Call in #[phone] with 11 rf 

## 2013-04-16 NOTE — Telephone Encounter (Signed)
I sent script e-scribe. 

## 2013-10-11 ENCOUNTER — Ambulatory Visit (INDEPENDENT_AMBULATORY_CARE_PROVIDER_SITE_OTHER): Payer: Medicaid Other | Admitting: Family Medicine

## 2013-10-11 ENCOUNTER — Encounter: Payer: Self-pay | Admitting: Family Medicine

## 2013-10-11 VITALS — BP 102/64 | HR 71 | Temp 98.6°F | Ht 66.0 in | Wt 132.0 lb

## 2013-10-11 DIAGNOSIS — N644 Mastodynia: Secondary | ICD-10-CM

## 2013-10-11 LAB — HCG, QUANTITATIVE, PREGNANCY: hCG, Beta Chain, Quant, S: 0.28 m[IU]/mL

## 2013-10-11 NOTE — Progress Notes (Signed)
   Subjective:    Patient ID: [name], female    DOB: 1991-06-22, 23 y.o.   MRN: [id number]  HPI Here wondering if she is pregnant. She had a period from 09-23-13 to 09-27-13 as expected. She had Merina in place for 2 years but had this removed last July. She has had breast tenderness for several days and this is unusual for her. She notes that she had periods during one of her pregnancies up to the 5th month. No nausea. She has taken 2 at home urine pregnancy tests which were negative.   Review of Systems  Constitutional: Negative.        Objective:   Physical Exam  Constitutional: She appears well-developed and well-nourished.          Assessment & Plan:  We will check an HCG level.

## 2013-10-11 NOTE — Progress Notes (Signed)
Pre visit review using our clinic review tool, if applicable. No additional management support is needed unless otherwise documented below in the visit note. 

## 2013-10-14 ENCOUNTER — Telehealth: Payer: Self-pay | Admitting: Family Medicine

## 2013-10-14 NOTE — Telephone Encounter (Signed)
I left voice message with negative results.

## 2013-10-14 NOTE — Telephone Encounter (Signed)
Pt is callback

## 2013-10-14 NOTE — Telephone Encounter (Signed)
Pt would like blood work results °

## 2013-10-16 ENCOUNTER — Telehealth: Payer: Self-pay | Admitting: Family Medicine

## 2013-10-16 NOTE — Telephone Encounter (Signed)
Relevant patient education mailed to patient.  

## 2013-11-18 ENCOUNTER — Encounter: Payer: Self-pay | Admitting: Emergency Medicine

## 2013-11-18 ENCOUNTER — Emergency Department
Admission: EM | Admit: 2013-11-18 | Discharge: 2013-11-18 | Disposition: A | Discharge status: Home / Self Care | Payer: Medicaid Other | Source: Home / Self Care | Attending: Family Medicine | Admitting: Family Medicine

## 2013-11-18 DIAGNOSIS — L03019 Cellulitis of unspecified finger: Secondary | ICD-10-CM

## 2013-11-18 DIAGNOSIS — J069 Acute upper respiratory infection, unspecified: Secondary | ICD-10-CM

## 2013-11-18 DIAGNOSIS — IMO0001 Reserved for inherently not codable concepts without codable children: Secondary | ICD-10-CM

## 2013-11-18 DIAGNOSIS — L03011 Cellulitis of right finger: Principal | ICD-10-CM

## 2013-11-18 MED ORDER — BENZONATATE 200 MG PO CAPS: 200.0000 mg | ORAL_CAPSULE | Freq: Every day | ORAL | Status: DC

## 2013-11-18 MED ORDER — DOXYCYCLINE HYCLATE 100 MG PO CAPS: 100.0000 mg | ORAL_CAPSULE | Freq: Two times a day (BID) | ORAL | Status: DC

## 2013-11-18 NOTE — Discharge Instructions (Signed)
Soak finger in warm water several times daily.  Wear bandage if drainage occurs from fingernail.  Take plain Mucinex (1200 mg guaifenesin) twice daily for cough and congestion.  May add Sudafed for sinus congestion.   Increase fluid intake, rest. May use Afrin nasal spray (or generic oxymetazoline) twice daily for about 5 days.  Also recommend using saline nasal spray several times daily and saline nasal irrigation (AYR is a common brand) Try warm salt water gargles for sore throat.  Stop all antihistamines for now, and other non-prescription cough/cold preparations. May take Ibuprofen 200mg , 4 tabs every 8 hours with food for body aches, fever, etc.   Follow-up with family doctor if not improving 7 to 10 days.    Paronychia Paronychia is an inflammatory reaction involving the folds of the skin surrounding the fingernail. This is commonly caused by an infection in the skin around a nail. The most common cause of paronychia is frequent wetting of the hands (as seen with bartenders, food servers, nurses or others who wet their hands). This makes the skin around the fingernail susceptible to infection by bacteria (germs) or fungus. Other predisposing factors are:  Aggressive manicuring.  Nail biting.  Thumb sucking. The most common cause is a staphylococcal (a type of germ) infection, or a fungal (Candida) infection. When caused by a germ, it usually comes on suddenly with redness, swelling, pus and is often painful. It may get under the nail and form an abscess (collection of pus), or form an abscess around the nail. If the nail itself is infected with a fungus, the treatment is usually prolonged and may require oral medicine for up to one year. Your caregiver will determine the length of time treatment is required. The paronychia caused by bacteria (germs) may largely be avoided by not pulling on hangnails or picking at cuticles. When the infection occurs at the tips of the finger it is called  felon. When the cause of paronychia is from the herpes simplex virus (HSV) it is called herpetic whitlow. TREATMENT  When an abscess is present treatment is often incision and drainage. This means that the abscess must be cut open so the pus can get out. When this is done, the following home care instructions should be followed. HOME CARE INSTRUCTIONS   It is important to keep the affected fingers very dry. Rubber or plastic gloves over cotton gloves should be used whenever the hand must be placed in water.  Keep wound clean, dry and dressed as suggested by your caregiver between warm soaks or warm compresses.  Soak in warm water for fifteen to twenty minutes three to four times per day for bacterial infections. Fungal infections are very difficult to treat, so often require treatment for long periods of time.  For bacterial (germ) infections take antibiotics (medicine which kill germs) as directed and finish the prescription, even if the problem appears to be solved before the medicine is gone.  Only take over-the-counter or prescription medicines for pain, discomfort, or fever as directed by your caregiver. SEEK IMMEDIATE MEDICAL CARE IF:  You have redness, swelling, or increasing pain in the wound.  You notice pus coming from the wound.  You have a fever.  You notice a bad smell coming from the wound or dressing. Document Released: 02/22/2001 Document Revised: 11/21/2011 Document Reviewed: 10/24/2008 [facility] Patient Information 2014 [place], [place].

## 2013-11-18 NOTE — ED Notes (Signed)
[name] complains of redness and swelling around her nail of her right hand 4 th finger for 3 weeks. Last tetanus was in 2012.  Rolly SalterHaley reports ear pain, headaches, pressure in face, sore throat, runny nose, sneezing, wheezing, cough and hoarseness for 2 days. Denies fever, chills or sweats.

## 2013-11-18 NOTE — ED Provider Notes (Signed)
CSN: [id number]     Arrival date & time 11/18/13  1135 History   First MD Initiated Contact with Patient 11/18/13 1245     Chief Complaint  Patient presents with  . Wound Infection    right hand 4 th finger at the nail x 3 weeks  . Sore Throat    x 2 days  . Cough    x 2 days      HPI Comments: Patient complains of persistent swelling, redness, and pain in her right 4th finger for 3 weeks after she removed a "hangnail."  There has been no improvement with warm soaks. Two days ago she developed nasal congestion, mild sore throat, facial pressure, and a cough yesterday.  She has a past history of pneumonia.  The history is provided by the patient.    Past Medical History  Diagnosis Date  . Anxiety   . Previous cesarean delivery, antepartum condition or complication 04/01/2011  . History of kidney stones     x 4, first time age 23yo  . Anemia     with pregnancy  . Chronic headaches     began late 2012  . GERD (gastroesophageal reflux disease)   . Seasonal allergic rhinitis   . Depression    Past Surgical History  Procedure Laterality Date  . Cesarean section    . Tonsillectomy     Family History  Problem Relation Age of Onset  . Hypertension Mother   . Cancer Mother 34    colon  . Diabetes Mother     borderline  . Thyroid disease Mother   . Depression Mother   . Anxiety disorder Mother   . Hypertension Maternal Grandfather   . Heart disease Maternal Grandfather   . Other Father     unknown  . Cancer Father     brain cancer?  . ADD / ADHD Sister   . Heart disease Maternal Uncle   . Diabetes Maternal Grandmother   . Stroke Neg Hx   . Cancer Other     others on maternal side   History  Substance Use Topics  . Smoking status: Current Every Day Smoker -- 0.15 packs/day for 7 years    Types: Cigarettes  . Smokeless tobacco: Never Used     Comment: e-cigarette, trying to quit  . Alcohol Use: No   OB History   Grav Para Term Preterm Abortions TAB SAB Ect Mult  Living   2 1  1      1      Review of Systems + sore throat + cough + sneezing No pleuritic pain + wheezing + nasal congestion + post-nasal drainage + sinus pain/pressure No itchy/red eyes ? earache No hemoptysis No SOB No fever/chills No nausea No vomiting No abdominal pain No diarrhea No urinary symptoms No skin rash + fatigue No myalgias + headache Used OTC meds without relief  Allergies  Review of patient's allergies indicates no known allergies.  Home Medications   Current Outpatient Rx  Name  Route  Sig  Dispense  Refill  . benzonatate (TESSALON) 200 MG capsule   Oral   Take 1 capsule (200 mg total) by mouth at bedtime. Take as needed for cough   12 capsule   0   . doxycycline (VIBRAMYCIN) 100 MG capsule   Oral   Take 1 capsule (100 mg total) by mouth 2 (two) times daily.   20 capsule   0    BP 108/68  Pulse 64  Temp(Src) 98.2 F (36.8 C) (Oral)  Ht 5\' 5"  (1.651 m)  Wt 129 lb (58.514 kg)  BMI 21.47 kg/m2  SpO2 98%  LMP 11/12/2013 Physical Exam  Musculoskeletal:       Hands: Right fourth fingertip has erythema, swelling, and tenderness at ulnar edge and base of fingernail, as noted on diagram.  Nursing notes and Vital Signs reviewed. Appearance:  Patient appears healthy, stated age, and in no acute distress Eyes:  Pupils are equal, round, and reactive to light and accomodation.  Extraocular movement is intact.  Conjunctivae are not inflamed  Ears:  Canals normal.  Tympanic membranes normal.  Nose:  Mildly congested turbinates.  No sinus tenderness.   Pharynx:  Normal Neck:  Supple.  Tender shotty posterior nodes are palpated bilaterally  Lungs:  Clear to auscultation.  Breath sounds are equal.  Heart:  Regular rate and rhythm without murmurs, rubs, or gallops.  Abdomen:  Nontender without masses or hepatosplenomegaly.  Bowel sounds are present.  No CVA or flank tenderness.  Extremities:  No edema.  No calf tenderness Skin:  No rash present.     ED Course  Procedures        MDM   1. Paronychia of fourth finger, right   2. Acute upper respiratory infections of unspecified site; suspect early viral URI    Begin doxycycline.  Prescription written for Benzonatate [facility](Tessalon) to take at bedtime for night-time cough.  Soak finger in warm water several times daily.  Wear bandage if drainage occurs from fingernail.  Take plain Mucinex (1200 mg guaifenesin) twice daily for cough and congestion.  May add Sudafed for sinus congestion.   Increase fluid intake, rest. May use Afrin nasal spray (or generic oxymetazoline) twice daily for about 5 days.  Also recommend using saline nasal spray several times daily and saline nasal irrigation (AYR is a common brand) Try warm salt water gargles for sore throat.  Stop all antihistamines for now, and other non-prescription cough/cold preparations. May take Ibuprofen 200mg , 4 tabs every 8 hours with food for body aches, fever, etc.   Follow-up with family doctor if not improving 7 to 10 days.     Lattie HawStephen A Anurag Scarfo, MD 11/19/13 [phone]

## 2013-11-21 ENCOUNTER — Telehealth: Payer: Self-pay | Admitting: Emergency Medicine

## 2014-05-20 ENCOUNTER — Telehealth: Payer: Self-pay | Admitting: Family Medicine

## 2014-05-20 NOTE — Telephone Encounter (Signed)
Can you recommend this medication?

## 2014-05-20 NOTE — Telephone Encounter (Signed)
Pt said she would like a call back from Dr Clent Ridges about getting some AHD medicine. She said they have had a conversation about her getting on this some med

## 2014-05-21 MED ORDER — AMPHETAMINE-DEXTROAMPHET ER 10 MG PO CP24: 10.0000 mg | ORAL_CAPSULE | Freq: Every day | ORAL | Status: DC

## 2014-05-21 NOTE — Telephone Encounter (Signed)
Lets try Adderall XR for one month and then follow up with me

## 2014-05-21 NOTE — Telephone Encounter (Signed)
Script is ready for pick up and I left a voice message with the below information. 

## 2014-07-14 ENCOUNTER — Encounter: Payer: Self-pay | Admitting: Emergency Medicine

## 2014-09-12 NOTE — L&D Delivery Note (Signed)
Delivery Note   Pt AROM clear. Contractions increased in intensity immediately.  Pt requested epidural.  While pt was sitting upright in preparation for epidural, she was involunatry pushing per RN.  Layed supine and complete.  Pt delivered spontaneously in the bed without difficulty.  Upon arrival, screaming baby boy on mom's chest, pt doing well.  Placenta delivered easily.  Sent to Pathology.  Perineum evaluated, findings below.  At 8:14 AM a viable female was delivered via VBAC, Spontaneous (per RN).  APGAR: 8, 9; weight 6 lb 4.9 oz (2860 g).   Placenta status: Intact, Spontaneous Pathology.  Cord: 3 vessels with the following complications: None.  Cord pH: n/a  Anesthesia: None  Episiotomy: None Lacerations: Left Labial, no repair Suture Repair: None Est. Blood Loss (mL): 150  Mom to postpartum.  Baby to Couplet care / Skin to Skin.  Pt desires postpartum BTL.  NPO 4 hours prior to procedure.  Geryl RankinsVARNADO, [place]ah Cipriani 08/14/2015, 9:46 AM

## 2014-09-25 ENCOUNTER — Encounter (HOSPITAL_COMMUNITY): Payer: Self-pay | Admitting: Obstetrics and Gynecology

## 2014-11-11 ENCOUNTER — Encounter: Payer: Self-pay | Admitting: Family Medicine

## 2014-11-11 ENCOUNTER — Ambulatory Visit (INDEPENDENT_AMBULATORY_CARE_PROVIDER_SITE_OTHER): Payer: Medicaid Other | Admitting: Family Medicine

## 2014-11-11 VITALS — BP 110/68 | HR 89 | Temp 98.1°F | Ht 65.0 in | Wt 132.0 lb

## 2014-11-11 DIAGNOSIS — F419 Anxiety disorder, unspecified: Secondary | ICD-10-CM | POA: Diagnosis not present

## 2014-11-11 DIAGNOSIS — Z0000 Encounter for general adult medical examination without abnormal findings: Secondary | ICD-10-CM

## 2014-11-11 DIAGNOSIS — F900 Attention-deficit hyperactivity disorder, predominantly inattentive type: Secondary | ICD-10-CM | POA: Diagnosis not present

## 2014-11-11 MED ORDER — AMPHETAMINE-DEXTROAMPHET ER 20 MG PO CP24: 20.0000 mg | ORAL_CAPSULE | Freq: Two times a day (BID) | ORAL | Status: DC

## 2014-11-11 MED ORDER — ALPRAZOLAM 0.25 MG PO TABS: 0.2500 mg | ORAL_TABLET | Freq: Three times a day (TID) | ORAL | Status: DC | PRN

## 2014-11-11 NOTE — Progress Notes (Signed)
Pre visit review using our clinic review tool, if applicable. No additional management support is needed unless otherwise documented below in the visit note. 

## 2014-11-11 NOTE — Progress Notes (Signed)
   Subjective:    Patient ID: [name], female    DOB: 1990/10/03, 24 y.o.   MRN: [id number]  HPI Here for several things. First she asks if the dose on Adderall can be increased. This helps her for a few hours in the morning but then wears off very quickly. She struggles all afternoon to focus and get her work done. 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 Also she asks for something to take prn when her anxiety acts up. She is raising 2 children by herself and is going through a custody battle over them.    Review of Systems  Constitutional: Negative.   Respiratory: Negative.   Cardiovascular: Negative.   Psychiatric/Behavioral: Positive for decreased concentration. Negative for behavioral problems, confusion, dysphoric mood and agitation. The patient is nervous/anxious.        Objective:   Physical Exam  Constitutional: She is oriented to person, place, and time. She appears well-developed and well-nourished.  Cardiovascular: Normal rate, regular rhythm, normal heart sounds and intact distal pulses.   Pulmonary/Chest: Effort normal and breath sounds normal.  Neurological: She is alert and oriented to person, place, and time.  Psychiatric: She has a normal mood and affect. Her behavior is normal. Thought content normal.          Assessment & Plan:  We will check Varicella IgG titers today. Given a flu shot. We will change Adderall to XR 20 mg and dose this BID. Try Xanax prn

## 2014-11-12 LAB — VARICELLA ZOSTER ANTIBODY, IGG: Varicella IgG: 3100 Index — ABNORMAL HIGH (ref <135.00)

## 2014-12-12 ENCOUNTER — Telehealth: Payer: Self-pay | Admitting: Family Medicine

## 2014-12-12 NOTE — Telephone Encounter (Signed)
Pt needs new rx generic adderall xr 20 mg. Pt is aware md out of office today

## 2014-12-15 MED ORDER — AMPHETAMINE-DEXTROAMPHET ER 20 MG PO CP24: 20.0000 mg | ORAL_CAPSULE | Freq: Two times a day (BID) | ORAL | Status: DC

## 2014-12-15 NOTE — Telephone Encounter (Signed)
done

## 2014-12-15 NOTE — Telephone Encounter (Signed)
Script is ready for pick up and I spoke with pt.  

## 2015-02-03 LAB — OB RESULTS CONSOLE RUBELLA ANTIBODY, IGM: RUBELLA: IMMUNE

## 2015-02-04 ENCOUNTER — Other Ambulatory Visit (HOSPITAL_COMMUNITY)
Admission: RE | Admit: 2015-02-04 | Discharge: 2015-02-04 | Disposition: A | Discharge status: Home / Self Care | Payer: Medicaid Other | Source: Ambulatory Visit | Attending: Obstetrics and Gynecology | Admitting: Obstetrics and Gynecology

## 2015-02-04 ENCOUNTER — Other Ambulatory Visit: Payer: Self-pay | Admitting: Obstetrics and Gynecology

## 2015-02-04 DIAGNOSIS — Z01419 Encounter for gynecological examination (general) (routine) without abnormal findings: Secondary | ICD-10-CM | POA: Insufficient documentation

## 2015-02-05 LAB — CYTOLOGY - PAP

## 2015-04-09 ENCOUNTER — Ambulatory Visit (HOSPITAL_COMMUNITY): Payer: Medicaid Other | Admitting: Licensed Clinical Social Worker

## 2015-04-28 ENCOUNTER — Other Ambulatory Visit: Payer: Self-pay | Admitting: Obstetrics and Gynecology

## 2015-04-28 DIAGNOSIS — Z3A21 21 weeks gestation of pregnancy: Secondary | ICD-10-CM

## 2015-04-28 DIAGNOSIS — O350XX0 Maternal care for (suspected) central nervous system malformation in fetus, not applicable or unspecified: Secondary | ICD-10-CM

## 2015-04-28 DIAGNOSIS — IMO0002 Reserved for concepts with insufficient information to code with codable children: Secondary | ICD-10-CM

## 2015-04-30 ENCOUNTER — Ambulatory Visit (HOSPITAL_COMMUNITY)
Admission: RE | Admit: 2015-04-30 | Discharge: 2015-04-30 | Disposition: A | Discharge status: Home / Self Care | Payer: Medicaid Other | Source: Ambulatory Visit | Attending: Obstetrics and Gynecology | Admitting: Obstetrics and Gynecology

## 2015-04-30 ENCOUNTER — Other Ambulatory Visit: Payer: Self-pay | Admitting: Obstetrics and Gynecology

## 2015-04-30 ENCOUNTER — Encounter (HOSPITAL_COMMUNITY): Payer: Self-pay | Admitting: Obstetrics and Gynecology

## 2015-04-30 ENCOUNTER — Encounter (HOSPITAL_COMMUNITY): Payer: Self-pay

## 2015-04-30 ENCOUNTER — Other Ambulatory Visit (HOSPITAL_COMMUNITY): Payer: Self-pay | Admitting: Obstetrics and Gynecology

## 2015-04-30 DIAGNOSIS — O350XX0 Maternal care for (suspected) central nervous system malformation in fetus, not applicable or unspecified: Secondary | ICD-10-CM

## 2015-04-30 DIAGNOSIS — Z3A21 21 weeks gestation of pregnancy: Secondary | ICD-10-CM

## 2015-04-30 DIAGNOSIS — IMO0001 Reserved for inherently not codable concepts without codable children: Secondary | ICD-10-CM

## 2015-04-30 DIAGNOSIS — IMO0002 Reserved for concepts with insufficient information to code with codable children: Secondary | ICD-10-CM | POA: Insufficient documentation

## 2015-04-30 DIAGNOSIS — Z315 Encounter for genetic counseling: Secondary | ICD-10-CM | POA: Insufficient documentation

## 2015-04-30 DIAGNOSIS — Z3689 Encounter for other specified antenatal screening: Secondary | ICD-10-CM

## 2015-04-30 DIAGNOSIS — O359XX1 Maternal care for (suspected) fetal abnormality and damage, unspecified, fetus 1: Secondary | ICD-10-CM

## 2015-04-30 DIAGNOSIS — Z8751 Personal history of pre-term labor: Secondary | ICD-10-CM

## 2015-04-30 DIAGNOSIS — O359XX0 Maternal care for (suspected) fetal abnormality and damage, unspecified, not applicable or unspecified: Secondary | ICD-10-CM | POA: Insufficient documentation

## 2015-04-30 DIAGNOSIS — Z36 Encounter for antenatal screening of mother: Secondary | ICD-10-CM | POA: Diagnosis not present

## 2015-04-30 NOTE — Progress Notes (Signed)
Genetic Counseling  High-Risk Gestation Note  Appointment Date:  04/30/2015 Referred By: Geryl Rankins, MD Date of Birth:  [birth year]   Pregnancy History: [id number] Estimated Date of Delivery: 09/04/15 Estimated Gestational Age: [redacted]w[redacted]d Attending: Rema Fendt, MD   [name] was seen for genetic counseling because of the ultrasound finding of choroid plexus cyst.  In Summary:  Choroid plexus cyst visualized on ultrasound today; Remaining visualized fetal anatomy appeared normal  Reviewed association with fetal aneuploidy, particularly trisomy 85  Patient is 24 y.o. and had screen negative Quad screen  Discussed that the ultrasound finding of isolated CPC would not be associated with increased risk for trisomy 63 given her age and low risk Quad screen  Offered NIPS and amniocentesis; Patient declined both today.   Ultrasound today also visualized choroid plexus cyst. The ultrasound report will be sent under separate cover.    We discussed that the second trimester genetic sonogram is targeted at identifying features associated with aneuploidy.  It has evolved as a screening tool used to provide an individualized risk assessment for Down syndrome and other trisomies.  The ability of sonography to aid in the detection of aneuploidies relies on identification of both major structural anomalies and "soft markers."  The patient was counseled that the latter term refers to findings that are often normal variants and do not cause any significant medical problems.  Nonetheless, these markers have a known association with aneuploidy.    [name] was counseled that the choroid plexus is an area in the brain where cerebral spinal fluid, the fluid that bathes the brain and spinal cord, is made.  Cysts, or fluid filled sacs, are sometimes found in the choroid plexus of babies both before and after they are born.  We discussed that approximately 1% of pregnancies evaluated by ultrasound will  show choroid plexus cyst (CPCs).  Literature suggests that CPCs are an ultrasound finding in approximately 30-50% of fetuses with trisomy 18, but are an isolated finding in less than 10% of fetuses with trisomy 6.  [name] was counseled that when a patient has other risk factors for fetal trisomy 81 (abnormal First trimester or quad screening, advanced maternal age, or another ultrasound finding), CPCs are associated with an increased risk (LR of 9) for trisomy 31.  Newer literature suggests that in the absence of other risk factors, CPCs are likely a normal variation of development or a benign finding.  CPCs are not associated with an increased risk for fetal Down syndrome.  We reviewed chromosomes, nondisjunction, and the common features and poor prognosis of trisomy 9.  We also reviewed [name] normal Quad screening result, which was reported to be not increased above her age related risk of 1 in 53. Considering her maternal age of 24 y.o., her otherwise normal fetal anatomy ultrasound, and her normal Quad screening result, [name] risk for fetal trisomy 70 is not expected to be increased above her screen adjusted risk.  However, additional testing options for detection of fetal trisomy 16 were discussed.    We reviewed the availability of noninvasive prenatal screening (NIPS)/cell free DNA testing. We discussed the methodology, detection rates, and false positive rates.  In addition, we discussed the option of diagnostic testing via amniocentesis.  We reviewed the approximate 1 in 300-500 risk for complications, including spontaneous pregnancy loss. After consideration of all the options, she declined NIPS and amniocentesis.  [name] was provided with written information regarding cystic fibrosis (CF) including the carrier  frequency and incidence in the Caucasian population, the availability of carrier testing and prenatal diagnosis if indicated.  In addition, we discussed that CF is routinely  screened for as part of the [place] newborn screening panel.  She reported that she thinks she had CF carrier screening performed in her first pregnancy and that it was normal. We do not currently have medical records confirming this report at this time.    Both family histories were reviewed and found to be contributory for medical concerns from the patient's 32 year-old daughter, who has a different father from the current pregnancy. Her daughter was reportedly born at [redacted] weeks gestation. She had a hole in the heart, which resolved without medical treatment. She also reportedly has congenital deafness in her left ear due to nerve damage and has a learning disability. We discussed that these features could related to prematurity and/or a different underlying etiology. Additional information regarding the specific cause(s) is needed to accurately assess recurrence risk for relatives.    Additionally, the father of the pregnancy reportedly has a female maternal first cousin once removed with Down syndrome. We discussed that 95% of cases of Down syndrome are not inherited and are the result of non-disjunction.  Three to 4% of cases of Down syndrome are the result of a translocation involving chromosome #21.  We discussed the option of chromosome analysis to determine if an individual is a carrier of a balanced translocation involving chromosome #21.  If an individual carries a balanced translocation involving chromosome #21, then the chance to have a baby with Down syndrome would be greater than the maternal age-related risk.   Given the reported family history, this relative's Down syndrome is most likely sporadic. Additional information regarding their karyotype may further refine recurrence risk estimate for relatives.   [name] reported that her mother had colon cancer at age 17 years old. She reported no additional relatives with cancers. The father of the pregnancy's mother reportedly had breat cancer diagnosed  younger than age 78 years old. Though most cancers are thought to be sporadic or due to environmental factors, some families appear to have a strong predisposition to cancers.  When considering a family history of cancer, we look for common types of cancer in multiple family members occurring at younger than typical ages. we discussed the option of meeting with a cancer genetic counselor to discuss any possible screening or testing options available. If they are concerned about the family history of cancer and would like to learn more about the family's chance for an inherited cancer syndrome, they may be referred to [facility] [phone]). Without further information regarding the provided family history, an accurate genetic risk cannot be calculated. Further genetic counseling is warranted if more information is obtained.  [name] denied exposure to environmental toxins or chemical agents. She denied the use of alcohol, tobacco or street drugs. She denied significant viral illnesses during the course of her pregnancy. Her medical and surgical histories were noncontributory.   I counseled [name] regarding the above risks and available options.  The approximate face-to-face time with the genetic counselor was 35 minutes.     Quinn Plowman, MS Certified Genetic Counselor 04/30/2015

## 2015-06-15 LAB — OB RESULTS CONSOLE RPR: RPR: NONREACTIVE

## 2015-07-13 ENCOUNTER — Encounter (HOSPITAL_COMMUNITY): Payer: Self-pay

## 2015-07-13 ENCOUNTER — Inpatient Hospital Stay (HOSPITAL_COMMUNITY)
Admission: AD | Admit: 2015-07-13 | Discharge: 2015-07-13 | Disposition: A | Discharge status: Home / Self Care | Payer: Medicaid Other | Source: Ambulatory Visit | Attending: Obstetrics and Gynecology | Admitting: Obstetrics and Gynecology

## 2015-07-13 DIAGNOSIS — F17210 Nicotine dependence, cigarettes, uncomplicated: Secondary | ICD-10-CM | POA: Diagnosis not present

## 2015-07-13 DIAGNOSIS — O99333 Smoking (tobacco) complicating pregnancy, third trimester: Secondary | ICD-10-CM | POA: Insufficient documentation

## 2015-07-13 DIAGNOSIS — O4703 False labor before 37 completed weeks of gestation, third trimester: Secondary | ICD-10-CM | POA: Diagnosis not present

## 2015-07-13 DIAGNOSIS — Z3A32 32 weeks gestation of pregnancy: Secondary | ICD-10-CM | POA: Diagnosis not present

## 2015-07-13 DIAGNOSIS — IMO0001 Reserved for inherently not codable concepts without codable children: Secondary | ICD-10-CM

## 2015-07-13 LAB — URINALYSIS, ROUTINE W REFLEX MICROSCOPIC
BILIRUBIN URINE: NEGATIVE
GLUCOSE, UA: NEGATIVE mg/dL
Ketones, ur: NEGATIVE mg/dL
NITRITE: NEGATIVE
PH: 7 (ref 5.0–8.0)
Protein, ur: NEGATIVE mg/dL
SPECIFIC GRAVITY, URINE: 1.01 (ref 1.005–1.030)
Urobilinogen, UA: 0.2 mg/dL (ref 0.0–1.0)

## 2015-07-13 LAB — AMNISURE RUPTURE OF MEMBRANE (ROM) NOT AT ARMC: AMNISURE: NEGATIVE

## 2015-07-13 LAB — URINE MICROSCOPIC-ADD ON

## 2015-07-13 LAB — FETAL FIBRONECTIN: Fetal Fibronectin: NEGATIVE

## 2015-07-13 MED ORDER — NIFEDIPINE 10 MG PO CAPS
20.0000 mg | ORAL_CAPSULE | Freq: Once | ORAL | Status: AC
  Administered 2015-07-13: 20 mg via ORAL
  Filled 2015-07-13: qty 2

## 2015-07-13 MED ORDER — BETAMETHASONE SOD PHOS & ACET 6 (3-3) MG/ML IJ SUSP
12.0000 mg | Freq: Once | INTRAMUSCULAR | Status: AC
  Administered 2015-07-13: 12 mg via INTRAMUSCULAR
  Filled 2015-07-13: qty 2

## 2015-07-13 NOTE — MAU Provider Note (Signed)
History   [id number] aat 32.3 wks Sent from office having preterm contractions. Pt states they are not painful. Also states leaking fluid not sure if it was from office exam.  CSN: [id number]  Arrival date and time: 07/13/15 1203   First Provider Initiated Contact with Patient 07/13/15 1259      Chief Complaint  Patient presents with  . Contractions   HPI  OB History    Gravida Para Term Preterm AB TAB SAB Ectopic Multiple Living   3 2 1 1      2       Past Medical History  Diagnosis Date  . Anxiety   . Previous cesarean delivery, antepartum condition or complication 04/01/2011  . History of kidney stones     x 4, first time age 24yo  . Anemia     with pregnancy  . Chronic headaches     began late 2012  . GERD (gastroesophageal reflux disease)   . Seasonal allergic rhinitis   . Depression     Past Surgical History  Procedure Laterality Date  . Cesarean section    . Tonsillectomy      Family History  Problem Relation Age of Onset  . Hypertension Mother   . Cancer Mother [id number]    colon  . Diabetes Mother     borderline  . Thyroid disease Mother   . Depression Mother   . Anxiety disorder Mother   . Hypertension Maternal Grandfather   . Heart disease Maternal Grandfather   . Other Father     unknown  . Cancer Father     brain cancer?  . ADD / ADHD Sister   . Heart disease Maternal Uncle   . Diabetes Maternal Grandmother   . Stroke Neg Hx   . Cancer Other     others on maternal side    Social History  Substance Use Topics  . Smoking status: Current Every Day Smoker -- 0.15 packs/day for 7 years    Types: Cigarettes  . Smokeless tobacco: Never Used     Comment: e-cigarette, trying to quit  . Alcohol Use: No    Allergies: No Known Allergies  Prescriptions prior to admission  Medication Sig Dispense Refill Last Dose  . ALPRAZolam (XANAX) 0.25 MG tablet Take 1 tablet (0.25 mg total) by mouth 3 (three) times daily as needed for anxiety. (Patient not  taking: Reported on 04/30/2015) 60 tablet 2 Not Taking  . amphetamine-dextroamphetamine (ADDERALL XR) 20 MG 24 hr capsule Take 1 capsule (20 mg total) by mouth 2 (two) times daily. (Patient not taking: Reported on 04/30/2015) 60 capsule 0 Not Taking  . Prenatal Vit-Fe Fumarate-FA (PRENATAL VITAMIN PO) Take by mouth.   Taking    Review of Systems  Constitutional: Negative.   HENT: Negative.   Eyes: Negative.   Cardiovascular: Negative.   Gastrointestinal: Negative.   Genitourinary: Negative.   Musculoskeletal: Positive for back pain.  Skin: Negative.   Neurological: Negative.   Endo/Heme/Allergies: Negative.   Psychiatric/Behavioral: Negative.    Physical Exam   Blood pressure 113/64, pulse 81, temperature 98.4 F (36.9 C), temperature source Oral, resp. rate 18, height 5\' 6"  (1.676 m), weight 149 lb (67.586 kg), last menstrual period 12/03/2014.  Physical Exam  Constitutional: She is oriented to person, place, and time. She appears well-developed and well-nourished.  HENT:  Head: Normocephalic.  Eyes: Pupils are equal, round, and reactive to light.  Neck: Normal range of motion.  Cardiovascular: Normal rate, regular rhythm, normal heart  sounds and intact distal pulses.   Respiratory: Effort normal and breath sounds normal.  GI: Soft. Bowel sounds are normal.  Genitourinary: Vagina normal and uterus normal.  Musculoskeletal: Normal range of motion.  Neurological: She is alert and oriented to person, place, and time. She has normal reflexes.  Skin: Skin is warm and dry.  Psychiatric: She has a normal mood and affect. Her behavior is normal. Judgment and thought content normal.    MAU Course  Procedures  MDM Preterm contractions at 32.3  Assessment and Plan  FFN neg, amnisure neg, Dr. Dion Body called to inquire about pts condition and states will be by to reassess her prior to discharge. Pt to be d'cd home to be bedrest and follow up with Dr. Dion Body 1 week.  Angla Delahunt  DARLENE 07/13/2015, 1:09 PM

## 2015-07-13 NOTE — Discharge Instructions (Signed)
Braxton Hicks Contractions °Contractions of the uterus can occur throughout pregnancy. Contractions are not always a sign that you are in labor.  °WHAT ARE BRAXTON HICKS CONTRACTIONS?  °Contractions that occur before labor are called Braxton Hicks contractions, or false labor. Toward the end of pregnancy (32-34 weeks), these contractions can develop more often and may become more forceful. This is not true labor because these contractions do not result in opening (dilatation) and thinning of the cervix. They are sometimes difficult to tell apart from true labor because these contractions can be forceful and people have different pain tolerances. You should not feel embarrassed if you go to the hospital with false labor. Sometimes, the only way to tell if you are in true labor is for your health care provider to look for changes in the cervix. °If there are no prenatal problems or other health problems associated with the pregnancy, it is completely safe to be sent home with false labor and await the onset of true labor. °HOW CAN YOU TELL THE DIFFERENCE BETWEEN TRUE AND FALSE LABOR? °False Labor °· The contractions of false labor are usually shorter and not as hard as those of true labor.   °· The contractions are usually irregular.   °· The contractions are often felt in the front of the lower abdomen and in the groin.   °· The contractions may go away when you walk around or change positions while lying down.   °· The contractions get weaker and are shorter lasting as time goes on.   °· The contractions do not usually become progressively stronger, regular, and closer together as with true labor.   °True Labor °· Contractions in true labor last 30-70 seconds, become very regular, usually become more intense, and increase in frequency.   °· The contractions do not go away with walking.   °· The discomfort is usually felt in the top of the uterus and spreads to the lower abdomen and low back.   °· True labor can be  determined by your health care provider with an exam. This will show that the cervix is dilating and getting thinner.   °WHAT TO REMEMBER °· Keep up with your usual exercises and follow other instructions given by your health care provider.   °· Take medicines as directed by your health care provider.   °· Keep your regular prenatal appointments.   °· Eat and drink lightly if you think you are going into labor.   °· If Braxton Hicks contractions are making you uncomfortable:   °¨ Change your position from lying down or resting to walking, or from walking to resting.   °¨ Sit and rest in a tub of warm water.   °¨ Drink 2-3 glasses of water. Dehydration may cause these contractions.   °¨ Do slow and deep breathing several times an hour.   °WHEN SHOULD I SEEK IMMEDIATE MEDICAL CARE? °Seek immediate medical care if: °· Your contractions become stronger, more regular, and closer together.   °· You have fluid leaking or gushing from your vagina.   °· You have a fever.   °· You pass blood-tinged mucus.   °· You have vaginal bleeding.   °· You have continuous abdominal pain.   °· You have low back pain that you never had before.   °· You feel your baby's head pushing down and causing pelvic pressure.   °· Your baby is not moving as much as it used to.   °  °This information is not intended to replace advice given to you by your health care provider. Make sure you discuss any questions you have with your health care   provider. °  °Document Released: 08/29/2005 Document Revised: 09/03/2013 Document Reviewed: 06/10/2013 °Elsevier Interactive Patient Education ©2016 [employer] ° °

## 2015-07-13 NOTE — MAU Note (Signed)
Pt went to doctors office today for routine appointment. Pt's cervix had changed to 1cm and pt feeling some contractions.

## 2015-07-14 ENCOUNTER — Inpatient Hospital Stay (HOSPITAL_COMMUNITY)
Admission: AD | Admit: 2015-07-14 | Discharge: 2015-07-14 | Disposition: A | Discharge status: Home / Self Care | Payer: Medicaid Other | Source: Ambulatory Visit | Attending: Obstetrics and Gynecology | Admitting: Obstetrics and Gynecology

## 2015-07-14 DIAGNOSIS — Z3A34 34 weeks gestation of pregnancy: Secondary | ICD-10-CM | POA: Insufficient documentation

## 2015-07-14 MED ORDER — BETAMETHASONE SOD PHOS & ACET 6 (3-3) MG/ML IJ SUSP
12.0000 mg | Freq: Once | INTRAMUSCULAR | Status: AC
  Administered 2015-07-14: 12 mg via INTRAMUSCULAR
  Filled 2015-07-14: qty 2

## 2015-07-14 NOTE — MAU Note (Addendum)
Reports flushing since yesterday.  Discussed side effects of procardia and steroids. Has had some contractions when up (reinforced bedrest). Some mucous d/c; this is not new

## 2015-07-21 ENCOUNTER — Encounter (HOSPITAL_COMMUNITY): Payer: Self-pay | Admitting: *Deleted

## 2015-07-21 ENCOUNTER — Telehealth: Payer: Self-pay

## 2015-07-21 ENCOUNTER — Inpatient Hospital Stay (HOSPITAL_COMMUNITY)
Admission: AD | Admit: 2015-07-21 | Discharge: 2015-07-23 | DRG: 778 | Disposition: A | Discharge status: Home / Self Care | Payer: Medicaid Other | Source: Ambulatory Visit | Attending: Obstetrics and Gynecology | Admitting: Obstetrics and Gynecology

## 2015-07-21 DIAGNOSIS — K219 Gastro-esophageal reflux disease without esophagitis: Secondary | ICD-10-CM | POA: Diagnosis present

## 2015-07-21 DIAGNOSIS — IMO0001 Reserved for inherently not codable concepts without codable children: Secondary | ICD-10-CM

## 2015-07-21 DIAGNOSIS — Z3A33 33 weeks gestation of pregnancy: Secondary | ICD-10-CM

## 2015-07-21 DIAGNOSIS — Z8751 Personal history of pre-term labor: Secondary | ICD-10-CM

## 2015-07-21 DIAGNOSIS — O99613 Diseases of the digestive system complicating pregnancy, third trimester: Secondary | ICD-10-CM | POA: Diagnosis present

## 2015-07-21 DIAGNOSIS — O34219 Maternal care for unspecified type scar from previous cesarean delivery: Secondary | ICD-10-CM | POA: Diagnosis present

## 2015-07-21 DIAGNOSIS — R0602 Shortness of breath: Secondary | ICD-10-CM

## 2015-07-21 DIAGNOSIS — O99333 Smoking (tobacco) complicating pregnancy, third trimester: Secondary | ICD-10-CM | POA: Diagnosis present

## 2015-07-21 NOTE — Telephone Encounter (Signed)
Patient states she started having contractions for about an hour and a half.  Reports that they started at 6min apart and are now 1-842min apart.  Patient reports that she took procardia at 5pm, but was not having contractions prior to this.  Patient states medications is suppose to be taken every 6 hours and not prn.  Patient denies LOF, VB, and reports good fetal movement.  Patient reports increased pressure today. Patient instructed to report to MAU for PTL evaluation.  JE, CNM

## 2015-07-21 NOTE — MAU Provider Note (Signed)
History     CSN: [id number]  Arrival date and time: 07/21/15 2327   First Provider Initiated Contact with Patient 07/21/15 2358      Chief Complaint  Patient presents with  . Contractions   HPI [name] is a 24 y.o. [phone] at [redacted]w[redacted]d who presents to MAU today with complaint of contractions. The patient has a history of PTD at 34 weeks with a previous pregnancy. She states h/o PTL with this pregnancy and has been taking Procardia q 6 hours recently. Last dose today at 1700. She denies vaginal bleeding, discharge, LOF, N/V/D today. She does endorse normal fetal movement. She states contractions are q 2-6 minutes upon arrival in MAU. She denies recent intercourse. She states that at last cervical exam on 07/13/15 in the office her cervix was 1 cm dilated. She had a negative FFN that day as well. She was given BMZ on 10/31 and 11/1.   OB History    Gravida Para Term Preterm AB TAB SAB Ectopic Multiple Living   Past Medical History  Diagnosis Date  . Anxiety   . Previous cesarean delivery, antepartum condition or complication 04/01/2011  . History of kidney stones     x 4, first time age 62yo  . Anemia     with pregnancy  . Chronic headaches     began late 2012  . GERD (gastroesophageal reflux disease)   . Seasonal allergic rhinitis   . Depression     Past Surgical History  Procedure Laterality Date  . Cesarean section    . Tonsillectomy      Family History  Problem Relation Age of Onset  . Hypertension Mother   . Cancer Mother 54    colon  . Diabetes Mother     borderline  . Thyroid disease Mother   . Depression Mother   . Anxiety disorder Mother   . Hypertension Maternal Grandfather   . Heart disease Maternal Grandfather   . Other Father     unknown  . Cancer Father     brain cancer?  . ADD / ADHD Sister   . Heart disease Maternal Uncle   . Diabetes Maternal Grandmother   . Stroke Neg Hx   . Cancer Other     others on maternal  side    Social History  Substance Use Topics  . Smoking status: Current Every Day Smoker -- 0.15 packs/day for 7 years    Types: Cigarettes  . Smokeless tobacco: Never Used     Comment: e-cigarette, trying to quit  . Alcohol Use: No    Allergies: No Known Allergies  Prescriptions prior to admission  Medication Sig Dispense Refill Last Dose  . NIFEdipine (PROCARDIA) 10 MG capsule Take 10 mg by mouth every 6 (six) hours.   07/21/2015 at Unknown time  . Prenatal Vit-Fe Fumarate-FA (PRENATAL VITAMIN PO) Take by mouth.   07/20/2015 at Unknown time  . ALPRAZolam (XANAX) 0.25 MG tablet Take 1 tablet (0.25 mg total) by mouth 3 (three) times daily as needed for anxiety. (Patient not taking: Reported on 04/30/2015) 60 tablet 2 Not Taking  . amphetamine-dextroamphetamine (ADDERALL XR) 20 MG 24 hr capsule Take 1 capsule (20 mg total) by mouth 2 (two) times daily. (Patient not taking: Reported on 04/30/2015) 60 capsule 0 Not Taking    Review of Systems  Constitutional: Negative for fever and malaise/fatigue.  Gastrointestinal: Positive for  nausea and abdominal pain. Negative for vomiting, diarrhea and constipation.  Genitourinary:       Neg - vaginal bleeding, discharge, LOF   Physical Exam   Blood pressure 117/65, pulse 78, resp. rate 20, height 5\' 5"  (1.651 m), weight 149 lb 12.8 oz (67.949 kg), last menstrual period 12/03/2014, SpO2 99 %.  Physical Exam  Nursing note and vitals reviewed. Constitutional: She is oriented to person, place, and time. She appears well-developed and well-nourished. No distress.  HENT:  Head: Normocephalic and atraumatic.  Cardiovascular: Normal rate.   Respiratory: Effort normal.  GI: Soft. She exhibits no distension and no mass. There is tenderness (mild tenderness to palpation of the upper abdomen). There is no rebound and no guarding.  Neurological: She is alert and oriented to person, place, and time.  Skin: Skin is warm and dry. No erythema.  Psychiatric:  She has a normal mood and affect.  Dilation: 1.5 Effacement (%): 20 Cervical Position: Posterior Station: -3 Presentation: Vertex Exam by:: Harlon FlorJ. Carmina Walle, PA-C   Results for orders placed or performed during the hospital encounter of 07/21/15 (from the past 24 hour(s))  Urinalysis, Routine w reflex microscopic (not [facility])     Status: Abnormal   Collection Time: 07/22/15 12:05 AM  Result Value Ref Range   Color, Urine YELLOW YELLOW   APPearance CLEAR CLEAR   Specific Gravity, Urine 1.020 1.005 - 1.030   pH 6.5 5.0 - 8.0   Glucose, UA NEGATIVE NEGATIVE mg/dL   Hgb urine dipstick NEGATIVE NEGATIVE   Bilirubin Urine NEGATIVE NEGATIVE   Ketones, ur NEGATIVE NEGATIVE mg/dL   Protein, ur NEGATIVE NEGATIVE mg/dL   Urobilinogen, UA 0.2 0.0 - 1.0 mg/dL   Nitrite NEGATIVE NEGATIVE   Leukocytes, UA TRACE (A) NEGATIVE  Urine microscopic-add on     Status: Abnormal   Collection Time: 07/22/15 12:05 AM  Result Value Ref Range   Squamous Epithelial / LPF RARE RARE   WBC, UA 3-6 <3 WBC/hpf   Bacteria, UA FEW (A) RARE   Urine-Other MUCOUS PRESENT     Fetal Monitoring: Baseline: 130 bpm, moderate variability, + accelerations, no decelerations Contractions: q 2-5 minutes  MAU Course  Procedures None  MDM UA today Discussed patient with Dr. Su Hiltoberts. Admit to Antenatal for tocolytics. Continuous EFM. Discussed Betamethasone. Patient states this was given at last visit and she had second dose as well on 10/31 and 11/1. Confirmed in patient record.   Assessment and Plan  A: SIUP at 981w4d Preterm labor  P: Admit to Antenatal for tocolytics  Marny LowensteinJulie N Fleetwood Pierron, PA-C  07/22/2015, 12:34 AM

## 2015-07-21 NOTE — MAU Note (Signed)
Pt c/o contractions since 8pm-every 1.5-2 mins since 930. Has hx of PTL with prior pregnancy and this one. Took procardia at 5pm. Takes every 6 hours. Denies vag bleeding or LOF. +FM

## 2015-07-22 ENCOUNTER — Inpatient Hospital Stay (HOSPITAL_COMMUNITY): Payer: Medicaid Other

## 2015-07-22 ENCOUNTER — Encounter (HOSPITAL_COMMUNITY): Payer: Self-pay | Admitting: *Deleted

## 2015-07-22 DIAGNOSIS — Z3A33 33 weeks gestation of pregnancy: Secondary | ICD-10-CM | POA: Diagnosis not present

## 2015-07-22 DIAGNOSIS — O99333 Smoking (tobacco) complicating pregnancy, third trimester: Secondary | ICD-10-CM | POA: Diagnosis present

## 2015-07-22 DIAGNOSIS — K219 Gastro-esophageal reflux disease without esophagitis: Secondary | ICD-10-CM | POA: Diagnosis present

## 2015-07-22 DIAGNOSIS — O34219 Maternal care for unspecified type scar from previous cesarean delivery: Secondary | ICD-10-CM | POA: Diagnosis present

## 2015-07-22 DIAGNOSIS — O99613 Diseases of the digestive system complicating pregnancy, third trimester: Secondary | ICD-10-CM | POA: Diagnosis present

## 2015-07-22 LAB — URINALYSIS, ROUTINE W REFLEX MICROSCOPIC
BILIRUBIN URINE: NEGATIVE
Glucose, UA: NEGATIVE mg/dL
Hgb urine dipstick: NEGATIVE
KETONES UR: NEGATIVE mg/dL
NITRITE: NEGATIVE
PROTEIN: NEGATIVE mg/dL
Specific Gravity, Urine: 1.02 (ref 1.005–1.030)
UROBILINOGEN UA: 0.2 mg/dL (ref 0.0–1.0)
pH: 6.5 (ref 5.0–8.0)

## 2015-07-22 LAB — CBC
HCT: 34.1 % — ABNORMAL LOW (ref 36.0–46.0)
Hemoglobin: 11.6 g/dL — ABNORMAL LOW (ref 12.0–15.0)
MCH: 31.6 pg (ref 26.0–34.0)
MCHC: 34 g/dL (ref 30.0–36.0)
MCV: 92.9 fL (ref 78.0–100.0)
PLATELETS: 279 10*3/uL (ref 150–400)
RBC: 3.67 MIL/uL — AB (ref 3.87–5.11)
RDW: 13.6 % (ref 11.5–15.5)
WBC: 13.8 10*3/uL — ABNORMAL HIGH (ref 4.0–10.5)

## 2015-07-22 LAB — RAPID URINE DRUG SCREEN, HOSP PERFORMED
Amphetamines: NOT DETECTED
Barbiturates: NOT DETECTED
Benzodiazepines: NOT DETECTED
COCAINE: NOT DETECTED
OPIATES: NOT DETECTED
Tetrahydrocannabinol: NOT DETECTED

## 2015-07-22 LAB — HEPATITIS PANEL, ACUTE
HEP B C IGM: NEGATIVE
HEP B S AG: NEGATIVE
Hep A IgM: NEGATIVE

## 2015-07-22 LAB — GROUP B STREP BY PCR: Group B strep by PCR: NEGATIVE

## 2015-07-22 LAB — RAPID HIV SCREEN (HIV 1/2 AB+AG)
HIV 1/2 ANTIBODIES: NONREACTIVE
HIV-1 P24 ANTIGEN - HIV24: NONREACTIVE

## 2015-07-22 LAB — URINE MICROSCOPIC-ADD ON

## 2015-07-22 LAB — MAGNESIUM
MAGNESIUM: 1.8 mg/dL (ref 1.7–2.4)
Magnesium: 5.6 mg/dL — ABNORMAL HIGH (ref 1.7–2.4)

## 2015-07-22 LAB — TYPE AND SCREEN
ABO/RH(D): O POS
ANTIBODY SCREEN: NEGATIVE

## 2015-07-22 LAB — OB RESULTS CONSOLE HIV ANTIBODY (ROUTINE TESTING): HIV: NONREACTIVE

## 2015-07-22 LAB — OB RESULTS CONSOLE GBS: GBS: NEGATIVE

## 2015-07-22 MED ORDER — PRENATAL MULTIVITAMIN CH
1.0000 | ORAL_TABLET | Freq: Every day | ORAL | Status: DC
  Administered 2015-07-22: 1 via ORAL
  Filled 2015-07-22 (×3): qty 1

## 2015-07-22 MED ORDER — MAGNESIUM SULFATE 50 % IJ SOLN: 2.5000 g/h | INTRAMUSCULAR | Status: DC

## 2015-07-22 MED ORDER — MAGNESIUM SULFATE BOLUS VIA INFUSION
4.0000 g | Freq: Once | INTRAVENOUS | Status: AC
  Administered 2015-07-22: 4 g via INTRAVENOUS
  Filled 2015-07-22: qty 500

## 2015-07-22 MED ORDER — HYDROXYPROGESTERONE CAPROATE 250 MG/ML IM OIL: 250.0000 mg | TOPICAL_OIL | Freq: Once | INTRAMUSCULAR | Status: DC

## 2015-07-22 MED ORDER — CALCIUM CARBONATE ANTACID 500 MG PO CHEW
2.0000 | CHEWABLE_TABLET | ORAL | Status: DC | PRN
  Filled 2015-07-22: qty 2

## 2015-07-22 MED ORDER — NIFEDIPINE 10 MG PO CAPS
10.0000 mg | ORAL_CAPSULE | Freq: Four times a day (QID) | ORAL | Status: DC
Start: 2015-07-22 — End: 2015-07-23
  Administered 2015-07-22 – 2015-07-23 (×3): 10 mg via ORAL
  Filled 2015-07-22 (×3): qty 1

## 2015-07-22 MED ORDER — MAGNESIUM SULFATE 50 % IJ SOLN
2.5000 g/h | INTRAVENOUS | Status: DC
  Filled 2015-07-22: qty 80

## 2015-07-22 MED ORDER — ZOLPIDEM TARTRATE 5 MG PO TABS
5.0000 mg | ORAL_TABLET | Freq: Every evening | ORAL | Status: DC | PRN
  Administered 2015-07-22 (×2): 5 mg via ORAL
  Filled 2015-07-22 (×2): qty 1

## 2015-07-22 MED ORDER — DOCUSATE SODIUM 100 MG PO CAPS
100.0000 mg | ORAL_CAPSULE | Freq: Every day | ORAL | Status: DC
  Administered 2015-07-22: 100 mg via ORAL
  Filled 2015-07-22 (×4): qty 1

## 2015-07-22 MED ORDER — PANTOPRAZOLE SODIUM 40 MG PO TBEC
40.0000 mg | DELAYED_RELEASE_TABLET | Freq: Every day | ORAL | Status: DC
  Administered 2015-07-22: 40 mg via ORAL
  Filled 2015-07-22: qty 1

## 2015-07-22 MED ORDER — LACTATED RINGERS IV SOLN
INTRAVENOUS | Status: DC
  Administered 2015-07-22 (×3): via INTRAVENOUS

## 2015-07-22 MED ORDER — ACETAMINOPHEN 325 MG PO TABS
650.0000 mg | ORAL_TABLET | ORAL | Status: DC | PRN
  Administered 2015-07-22 (×2): 650 mg via ORAL
  Filled 2015-07-22 (×2): qty 2

## 2015-07-22 NOTE — Progress Notes (Signed)
Kennis CarinaHaley N Postlewaite is a 24 y.o. [phone] at 55105w5d admitted overnight due to PTL.  Pt has been on 17P due to h/o PT delivery and on Procardia due to preterm contractions.  Subjective: Pt states she still feels contractions.  Mild pressure.  Denies LOF, VB.  She feels SOB and is having some difficulty focusing. Pt reports that FOB is currently incarcerated due activity associated with a heroine addiction.  Pt had unprotected sex with earlier in the pregnancy, unsure of his Hep C status.  Pt denies h/o drug abuse although she did smoke THC once to help stop her contractions.  Pt consents to urine drug screen.  Objective: BP 113/67 mmHg  Pulse 70  Temp(Src) 98.5 F (36.9 C) (Oral)  Resp 16  Ht 5\' 5"  (1.651 m)  Wt 68.04 kg (150 lb)  BMI 24.96 kg/m2  SpO2 99%  LMP 12/03/2014 I/O last 3 completed shifts: In: 1881.5 [P.O.:950; I.V.:931.5] Out: 1400 [Urine:1400]    FHT:  Reactive, no decelerations UC:   irregular, every 5-10 minutes SVE:   2/20/-2, vertex  Gen:  NAD, alert Lungs:  CTA bilaterally, no crackles, wheezing or rhonchi Abd:  Soft, no fundal tenderness Ext:  SCDs on. Neuro: 2+ DTR.  Labs: Lab Results  Component Value Date   WBC 13.8* 07/22/2015   HGB 11.6* 07/22/2015   HCT 34.1* 07/22/2015   MCV 92.9 07/22/2015   PLT 279 07/22/2015  UA negative. FFN not done.  Assessment / Plan: IUP @ 33 5/7 weeks Category I tracing Preterm contractions on Magnesium sulfate 2.5 grams/hr. Contractions have spaced significantly since admission. SOB Partner IV drug user.  Continue Magnesium x 24 hours.  Check Magnesium level.  Reviewed symptoms of magnesium toxicity. Check urine drug screen and Hepatitis C. F/u GC/Chl, urine culture. Plan of care d/w pt and all questions answered.  Geryl RankinsVARNADO, Grainne Knights 07/22/2015, 8:48 AM

## 2015-07-22 NOTE — Progress Notes (Signed)
Since last assessment, pt developed worsening of SOB.  Magnesium was stopped.  Magnesium level was 5.6.  Chest Xray was ordered which was negative.  Currently, pt states she still feels a heaviness in her chest but it improved once the Magnesium was discontinued.  She realized in hind sight that she was having to work at focusing.  Denies chest pain or palpitations or leg pain or swelling. Pt reports a h/o "bad heartburn."  Takes Tums all the time.  Contractions have decreased in intensity significantly but she still feels them.  Feeling pressure and a very active fetus.  Denies LOF.  119/75 HR 75 O2 sat 100% on RA  Gen:  Pt appears comfortable. Ext:  SCDS in place.  No calf tenderness or edema noted. Cervix Deferred.  Cat 1 tracing Contractions occasional  A -IUP at 33 5/7 weeks -Preterm contractions vs. PTL -SOB/"Chest heaviness"-S/p Magnesium. Magnesium level was not significantly elevated but clinical presentation    concerning for toxicity.  No signs of DVT. S/sxs of DVT reviewed. -H/o Cesarean section, h/o VBAC x1- Desires VBAC with PP BTL. -Reflux.  P: Resume Procardia. Ultrasound ordered for EFW, Presentation. Protonix 40 mg po once daily. Plan to observe overnight.  Discharge in am if stable. F/u GC/Chl, Urine culture.  If chest heaviness persists, proceed with Spiral CT to rule out pulmonary embolism.  Pt given precautions.  Dr. Richardson Doppole covering after 5 pm.  Update given.

## 2015-07-22 NOTE — Progress Notes (Signed)
[name] MRN: [id number]  Subjective: -Patient resting in bed.  Reports feeling hot and confirms perception of contractions, although the pain has decreased. Patient also reports continued vaginal pressure, but states it is no worse than prior to arrival.   Objective: BP 117/65 mmHg  Pulse 78  Resp 20  Ht 5\' 5"  (1.651 m)  Wt 68.04 kg (150 lb)  BMI 24.96 kg/m2  SpO2 99%  LMP 12/03/2014     FHT: 135 bpm, Mod Var, -Decels, +Accels UC: Q2-54min, palpates mild to moderate   SVE: Deferred Membranes:Intact Pitocin:None MgSO4: 2grams  Assessment:  IUP at 33.5wks Cat I FT  Preterm Contractions MgSO4 Infusion H/O PTD  Plan: -Patient status discussed with Dr. Lance MorinA. Roberts who advised -Increasing MgSO4 infusion to 2.5gr -Patient informed of POC; No questions or concerns -Continue other mgmt as ordered   Sohum Delillo LYNN,MSN, CNM 07/22/2015, 3:11 AM

## 2015-07-22 NOTE — Progress Notes (Signed)
Updated Dr. Richardson Doppole re pt ctx pattern, resolved chest heaviness and SOB... No orders received.

## 2015-07-22 NOTE — H&P (Signed)
[name] is a 24 y.o. female presenting for contractions.  Patient reports contractions started at 2100 and was initially 6min apart and are now 1-12min apart. Patient reports that she took procardia at 5pm, but was not having contractions prior to this. Patient denies LOF, VB, and reports good fetal movement. Patient reports increased pressure today.  Patient is currently taking 17P, last dose on Monday.  Patient with history of preterm delivery at 34wks and term delivery at 7637wks.  PN records unavailable-All information extracted from Epic Notes.   Maternal Medical History:  Reason for admission: Contractions.   Contractions: Onset was 1-2 hours ago.   Frequency: regular.   Perceived severity is moderate.    Fetal activity: Perceived fetal activity is normal.   Last perceived fetal movement was within the past hour.    Prenatal Complications - Diabetes: none.    OB History    Gravida Para Term Preterm AB TAB SAB Ectopic Multiple Living   3 2 1 1      2      Past Medical History  Diagnosis Date  . Anxiety   . Previous cesarean delivery, antepartum condition or complication 04/01/2011  . History of kidney stones     x 4, first time age 24yo  . Anemia     with pregnancy  . Chronic headaches     began late 2012  . GERD (gastroesophageal reflux disease)   . Seasonal allergic rhinitis   . Depression    Past Surgical History  Procedure Laterality Date  . Cesarean section    . Tonsillectomy     Family History: family history includes ADD / ADHD in her sister; Anxiety disorder in her mother; Cancer in her father and other; Cancer (age of onset: 3636) in her mother; Depression in her mother; Diabetes in her maternal grandmother and mother; Heart disease in her maternal grandfather and maternal uncle; Hypertension in her maternal grandfather and mother; Other in her father; Thyroid disease in her mother. There is no history of Stroke. Social History:  reports that she has been  smoking Cigarettes.  She has a 1.75 pack-year smoking history. She has never used smokeless tobacco. She reports that she does not drink alcohol or use illicit drugs.   Prenatal Transfer Tool  Maternal Diabetes: No Genetic Screening: Normal Maternal Ultrasounds/Referrals: Abnormal:  Findings:   Isolated choroid plexus cyst Fetal Ultrasounds or other Referrals:  Referred to Materal Fetal Medicine , Other: Genetic Counseling Maternal Substance Abuse:  No Significant Maternal Medications:  Meds include: Progesterone Other:  Procardia Significant Maternal Lab Results:  Lab values include: Other: GBS Unknown Other Comments:  None  ROS  Dilation: 1.5 Effacement (%): 20 Station: -3 Exam by:: Harlon FlorJ. Wenzel, PA-C Blood pressure 117/65, pulse 78, resp. rate 20, height 5\' 5"  (1.651 m), weight 68.04 kg (150 lb), last menstrual period 12/03/2014, SpO2 99 %. Maternal Exam:  Uterine Assessment: Contraction strength is moderate.  Contraction frequency is regular.   Abdomen: Fundal height is AGA.   Fetal presentation: vertex  Introitus: Ferning test: not done.  Nitrazine test: not done. Amniotic fluid character: not assessed.  Pelvis: adequate for delivery.   Cervix: Cervix evaluated by digital exam.     Fetal Exam Fetal Monitor Review: Mode: ultrasound.   Baseline rate: 135.  Variability: moderate (6-25 bpm).   Pattern: accelerations present.    Fetal State Assessment: Category I - tracings are normal.     Physical Exam  Constitutional: She is oriented to person,  place, and time. She appears well-developed and well-nourished.  HENT:  Head: Normocephalic and atraumatic.  Eyes: EOM are normal.  Neck: Normal range of motion.  Cardiovascular: Normal rate.   Respiratory: Effort normal.  GI: Soft. There is tenderness.  Musculoskeletal: Normal range of motion.  Neurological: She is alert and oriented to person, place, and time.  Skin: Skin is warm and dry.    Prenatal labs: ABO, Rh:   O Positive Antibody:   Unknown Rubella:  Unknown RPR:   Unknown HBsAg:   Unknown HIV:   Pending GBS:   Pending  Assessment: IUP at 33.5wks Cat I FT Preterm Contractions S/P BMZ 10/31 & 11/1  Plan: Admit to Antenatal per consult with Dr. Lance Morin Start MgSO4 infusion Rapid GBS GC/CT Continuous FM  Trevone Prestwood LYNN MSN, CNM 07/22/2015, 2:07 AM

## 2015-07-22 NOTE — Discharge Instructions (Signed)

## 2015-07-23 MED ORDER — PANTOPRAZOLE SODIUM 40 MG PO TBEC: 40.0000 mg | DELAYED_RELEASE_TABLET | Freq: Every day | ORAL | Status: DC

## 2015-07-23 MED ORDER — NIFEDIPINE 10 MG PO CAPS: 10.0000 mg | ORAL_CAPSULE | Freq: Four times a day (QID) | ORAL | Status: DC

## 2015-07-23 NOTE — Discharge Summary (Signed)
Physician Discharge Summary  Patient ID: [name] MRN: [id number] DOB/AGE: 11-05-1990 24 y.o.  Admit date: 07/21/2015 Discharge date: 07/23/2015  Admission Diagnoses: IUP @ 33 5/7 weeks.  Preterm contractions on Procardia.  H/o preterm delivery on 17 P.  H/o cesarean section, VBAC x 1  Discharge Diagnoses: Same contractions resolved Principal Problem:   Preterm labor in third trimester without delivery   Discharged Condition: stable  Hospital Course: Pt presented with contractions every 2 minutes that was not relieved with Procardia at home.  Magnesium sulfate started with the intention to give BMZ but covering providers learned of previous BMZ after magnesium started.  Pt contractions spaced out on Magnesium when increased from 2 to 2.5 grams/1hour.  Preterm labor w/u negative.  GBS negative.  Drug screen negative.  Pt had chest tightening and difficulty focusing.  Magnesium level was 5.6, Chest Xray was normal.  Magnesium stopped, Protonix IV administered and symptoms improved.  Procardia started ~ 3 hours after magnesium discontinued.  Pt observed overnight. Contractions were few.   Consults: None  Significant Diagnostic Studies: See above.  Treatments: IV hydration and Magnesium tocolysis.  Discharge Exam: Blood pressure 103/58, pulse 68, temperature 98.2 F (36.8 C), temperature source Oral, resp. rate 18, height 5\' 5"  (1.651 m), weight 68.04 kg (150 lb), last menstrual period 12/03/2014, SpO2 99 %. Due to a miscommunication with RN, pt left prior to being seen by MD.  She reported that pt felt better and did not have contractions.  Disposition: 01-Home or Self Care  Discharge Instructions    Discharge activity: Bedrest    Complete by:  As directed      Discharge diet:  No restrictions    Complete by:  As directed      Discharge instructions    Complete by:  As directed   See discharge instructions.     Do not have sex or do anything that might make you have an orgasm     Complete by:  As directed      Fetal Kick Count:  Lie on our left side for one hour after a meal, and count the number of times your baby kicks.  If it is less than 5 times, get up, move around and drink some juice.  Repeat the test 30 minutes later.  If it is still less than 5 kicks in an hour, notify your doctor.    Complete by:  As directed      Notify physician for a general feeling that "something is not right"    Complete by:  As directed      Notify physician for increase or change in vaginal discharge    Complete by:  As directed      Notify physician for intestinal cramps, with or without diarrhea, sometimes described as "gas pain"    Complete by:  As directed      Notify physician for leaking of fluid    Complete by:  As directed      Notify physician for low, dull backache, unrelieved by heat or Tylenol    Complete by:  As directed      Notify physician for menstrual like cramps    Complete by:  As directed      Notify physician for pelvic pressure    Complete by:  As directed      Notify physician for uterine contractions.  These may be painless and feel like the uterus is tightening or the baby is  "balling  up"    Complete by:  As directed      Notify physician for vaginal bleeding    Complete by:  As directed      PRETERM LABOR:  Includes any of the follwing symptoms that occur between 20 - [redacted] weeks gestation.  If these symptoms are not stopped, preterm labor can result in preterm delivery, placing your baby at risk    Complete by:  As directed             Medication List    STOP taking these medications        ALPRAZolam 0.25 MG tablet  Commonly known as:  XANAX     amphetamine-dextroamphetamine 20 MG 24 hr capsule  Commonly known as:  ADDERALL XR      TAKE these medications        calcium carbonate 500 MG chewable tablet  Commonly known as:  TUMS - dosed in mg elemental calcium  Chew 1 tablet by mouth daily.     NIFEdipine 10 MG capsule  Commonly known  as:  PROCARDIA  Take 1 capsule (10 mg total) by mouth every 6 (six) hours.     pantoprazole 40 MG tablet  Commonly known as:  PROTONIX  Take 1 tablet (40 mg total) by mouth daily.     PRENATAL VITAMIN PO  Take 1 tablet by mouth daily.           Follow-up Information    Follow up with Geryl Rankins, MD. Schedule an appointment as soon as possible for a visit in 1 week.   Specialty:  Obstetrics and Gynecology   Why:  Hospital followu up   Contact information:   [address] [place] [address] [phone]       Signed: Geryl Rankins 07/23/2015, 6:34 AM

## 2015-08-02 ENCOUNTER — Encounter (HOSPITAL_COMMUNITY): Payer: Self-pay | Admitting: *Deleted

## 2015-08-02 ENCOUNTER — Inpatient Hospital Stay (HOSPITAL_COMMUNITY)
Admission: AD | Admit: 2015-08-02 | Discharge: 2015-08-02 | Disposition: A | Discharge status: Home / Self Care | Payer: Medicaid Other | Source: Ambulatory Visit | Attending: Obstetrics & Gynecology | Admitting: Obstetrics & Gynecology

## 2015-08-02 DIAGNOSIS — O99333 Smoking (tobacco) complicating pregnancy, third trimester: Secondary | ICD-10-CM | POA: Insufficient documentation

## 2015-08-02 DIAGNOSIS — F17210 Nicotine dependence, cigarettes, uncomplicated: Secondary | ICD-10-CM | POA: Insufficient documentation

## 2015-08-02 DIAGNOSIS — Z3A35 35 weeks gestation of pregnancy: Secondary | ICD-10-CM | POA: Diagnosis not present

## 2015-08-02 NOTE — MAU Provider Note (Signed)
History     CSN: [id number]  Arrival date and time: 08/02/15 1759   First Provider Initiated Contact with Patient 08/02/15 1906      Chief Complaint  Patient presents with  . Contractions   HPI  Pt is [redacted]w[redacted]d [id number] with onset of regular conrtactions 45 minutes prior to admission to MAU with ctx  Every 2 minutes  Baby has not been as active today. Pt has been on procardia  every 6 hours for preterm labor, but pt says it has not helped. - pt has had BTZ x 2. Pt does not have spotting, bleeding or LOF Pt has hx of preterm labor with previous pregnancies-C/Section for breech 1st pregnancy and then VBAC with 2nd pregnancy. RN note: Patient presents at [redacted] weeks gestation with c/o irregular contractions X 1 hour. Fetus less active today. Denies bleeding or abnormal discharge.  Past Medical History  Diagnosis Date  . Anxiety   . Previous cesarean delivery, antepartum condition or complication 04/01/2011  . History of kidney stones     x 4, first time age 34yo  . Anemia     with pregnancy  . Chronic headaches     began late 2012  . GERD (gastroesophageal reflux disease)   . Seasonal allergic rhinitis   . Depression     Past Surgical History  Procedure Laterality Date  . Cesarean section    . Tonsillectomy      Family History  Problem Relation Age of Onset  . Hypertension Mother   . Cancer Mother 39    colon  . Diabetes Mother     borderline  . Thyroid disease Mother   . Depression Mother   . Anxiety disorder Mother   . Hypertension Maternal Grandfather   . Heart disease Maternal Grandfather   . Other Father     unknown  . Cancer Father     brain cancer?  . ADD / ADHD Sister   . Heart disease Maternal Uncle   . Diabetes Maternal Grandmother   . Stroke Neg Hx   . Cancer Other     others on maternal side    Social History  Substance Use Topics  . Smoking status: Current Every Day Smoker -- 0.25 packs/day for 7 years    Types: Cigarettes  . Smokeless  tobacco: Never Used     Comment: e-cigarette, trying to quit  . Alcohol Use: No    Allergies: No Known Allergies  Prescriptions prior to admission  Medication Sig Dispense Refill Last Dose  . calcium carbonate (TUMS - DOSED IN MG ELEMENTAL CALCIUM) 500 MG chewable tablet Chew 1-2 tablets by mouth as needed for indigestion or heartburn.    Past Week at Unknown time  . NIFEdipine (PROCARDIA) 10 MG capsule Take 1 capsule (10 mg total) by mouth every 6 (six) hours. 60 capsule 1 08/02/2015 at Unknown time  . Prenatal Vit-Fe Fumarate-FA (PRENATAL MULTIVITAMIN) TABS tablet Take 1 tablet by mouth daily.   08/02/2015 at Unknown time  . pantoprazole (PROTONIX) 40 MG tablet Take 1 tablet (40 mg total) by mouth daily. (Patient not taking: Reported on 08/02/2015) 30 tablet 1     Review of Systems  Gastrointestinal: Positive for abdominal pain.   Physical Exam   Blood pressure 116/73, pulse 100, temperature 97.9 F (36.6 C), temperature source Oral, resp. rate 16, height  (1.651 m), weight 150 lb (68.04 kg), last menstrual period 12/03/2014.  Physical Exam  Nursing note and vitals reviewed. Constitutional: She is  oriented to person, place, and time. She appears well-developed and well-nourished. No distress.  HENT:  Head: Normocephalic.  Eyes: Pupils are equal, round, and reactive to light.  Neck: Normal range of motion. Neck supple.  Respiratory: Effort normal.  GI: Soft.  Ctx every 2-3 minutes mod intensity  Genitourinary:  Cervix 2 cm  Musculoskeletal: Normal range of motion.  Neurological: She is alert and oriented to person, place, and time.  Skin: Skin is warm.  Psychiatric: She has a normal mood and affect.    MAU Course  Procedures Reactive FHR 135 bpm with 6-25 bpm variability and 15x15 accelerations Care turned over to RN for labor eval  Assessment and Plan    [name] 08/02/2015, 7:08 PM

## 2015-08-02 NOTE — MAU Note (Signed)
Patient presents at [redacted] weeks gestation with c/o irregular contractions X 1 hour. Fetus less active today. Denies bleeding or abnormal discharge.

## 2015-08-02 NOTE — Discharge Instructions (Signed)

## 2015-08-04 ENCOUNTER — Encounter (HOSPITAL_COMMUNITY): Payer: Self-pay

## 2015-08-04 ENCOUNTER — Ambulatory Visit (HOSPITAL_COMMUNITY)
Admission: RE | Admit: 2015-08-04 | Discharge: 2015-08-04 | Disposition: A | Discharge status: Home / Self Care | Payer: Medicaid Other | Source: Ambulatory Visit | Attending: Obstetrics and Gynecology | Admitting: Obstetrics and Gynecology

## 2015-08-04 ENCOUNTER — Ambulatory Visit (HOSPITAL_COMMUNITY): Payer: Medicaid Other

## 2015-08-04 ENCOUNTER — Other Ambulatory Visit: Payer: Self-pay | Admitting: Obstetrics and Gynecology

## 2015-08-04 DIAGNOSIS — O358XX0 Maternal care for other (suspected) fetal abnormality and damage, not applicable or unspecified: Secondary | ICD-10-CM

## 2015-08-04 DIAGNOSIS — O283 Abnormal ultrasonic finding on antenatal screening of mother: Secondary | ICD-10-CM

## 2015-08-04 DIAGNOSIS — Z8751 Personal history of pre-term labor: Secondary | ICD-10-CM

## 2015-08-04 DIAGNOSIS — Z3A34 34 weeks gestation of pregnancy: Secondary | ICD-10-CM | POA: Diagnosis not present

## 2015-08-04 DIAGNOSIS — O368130 Decreased fetal movements, third trimester, not applicable or unspecified: Secondary | ICD-10-CM | POA: Diagnosis not present

## 2015-08-07 ENCOUNTER — Other Ambulatory Visit (HOSPITAL_COMMUNITY): Payer: Medicaid Other

## 2015-08-07 ENCOUNTER — Encounter (HOSPITAL_COMMUNITY): Payer: Self-pay

## 2015-08-07 ENCOUNTER — Ambulatory Visit (HOSPITAL_COMMUNITY)
Admission: RE | Admit: 2015-08-07 | Discharge: 2015-08-07 | Disposition: A | Discharge status: Home / Self Care | Payer: Medicaid Other | Source: Ambulatory Visit | Attending: Obstetrics and Gynecology | Admitting: Obstetrics and Gynecology

## 2015-08-07 VITALS — BP 110/71 | HR 98 | Wt 154.4 lb

## 2015-08-07 DIAGNOSIS — Z8751 Personal history of pre-term labor: Secondary | ICD-10-CM | POA: Diagnosis not present

## 2015-08-07 DIAGNOSIS — O368130 Decreased fetal movements, third trimester, not applicable or unspecified: Secondary | ICD-10-CM | POA: Insufficient documentation

## 2015-08-07 DIAGNOSIS — Z3A35 35 weeks gestation of pregnancy: Secondary | ICD-10-CM | POA: Diagnosis not present

## 2015-08-07 DIAGNOSIS — O358XX0 Maternal care for other (suspected) fetal abnormality and damage, not applicable or unspecified: Secondary | ICD-10-CM

## 2015-08-09 ENCOUNTER — Inpatient Hospital Stay (HOSPITAL_COMMUNITY)
Admission: AD | Admit: 2015-08-09 | Discharge: 2015-08-09 | Disposition: A | Discharge status: Home / Self Care | Payer: Medicaid Other | Source: Ambulatory Visit | Attending: Obstetrics and Gynecology | Admitting: Obstetrics and Gynecology

## 2015-08-09 ENCOUNTER — Encounter (HOSPITAL_COMMUNITY): Payer: Self-pay | Admitting: *Deleted

## 2015-08-09 DIAGNOSIS — F17210 Nicotine dependence, cigarettes, uncomplicated: Secondary | ICD-10-CM | POA: Insufficient documentation

## 2015-08-09 DIAGNOSIS — IMO0001 Reserved for inherently not codable concepts without codable children: Secondary | ICD-10-CM

## 2015-08-09 DIAGNOSIS — N898 Other specified noninflammatory disorders of vagina: Secondary | ICD-10-CM

## 2015-08-09 DIAGNOSIS — O368130 Decreased fetal movements, third trimester, not applicable or unspecified: Secondary | ICD-10-CM | POA: Insufficient documentation

## 2015-08-09 DIAGNOSIS — O26893 Other specified pregnancy related conditions, third trimester: Secondary | ICD-10-CM

## 2015-08-09 DIAGNOSIS — O99333 Smoking (tobacco) complicating pregnancy, third trimester: Secondary | ICD-10-CM | POA: Insufficient documentation

## 2015-08-09 DIAGNOSIS — O99891 Other specified diseases and conditions complicating pregnancy: Secondary | ICD-10-CM | POA: Diagnosis present

## 2015-08-09 DIAGNOSIS — Z3A36 36 weeks gestation of pregnancy: Secondary | ICD-10-CM | POA: Insufficient documentation

## 2015-08-09 DIAGNOSIS — O26899 Other specified pregnancy related conditions, unspecified trimester: Secondary | ICD-10-CM | POA: Diagnosis present

## 2015-08-09 DIAGNOSIS — O358XX0 Maternal care for other (suspected) fetal abnormality and damage, not applicable or unspecified: Secondary | ICD-10-CM | POA: Insufficient documentation

## 2015-08-09 LAB — URINALYSIS, ROUTINE W REFLEX MICROSCOPIC
Bilirubin Urine: NEGATIVE
Glucose, UA: NEGATIVE mg/dL
KETONES UR: NEGATIVE mg/dL
Nitrite: NEGATIVE
PH: 6 (ref 5.0–8.0)
PROTEIN: NEGATIVE mg/dL
Specific Gravity, Urine: 1.025 (ref 1.005–1.030)

## 2015-08-09 LAB — AMNISURE RUPTURE OF MEMBRANE (ROM) NOT AT ARMC: Amnisure ROM: NEGATIVE

## 2015-08-09 LAB — URINE MICROSCOPIC-ADD ON

## 2015-08-09 NOTE — MAU Provider Note (Signed)
Chief Complaint:  Decreased Fetal Movement and Rupture of Membranes  HPI  [name] is a 24 y.o. [phone] at 34w2dwho presents to maternity admissions reporting decreased fetal movement.  She also reports having some vaginal fluid leakage last night around 2100. She denies vaginal bleeding, vaginal itching/burning, urinary symptoms, h/a, dizziness, n/v, or fever/chills.  She denies headache, visual changes or abdominal pain.          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  She has been followed by MFM and Dr Dion Body for this. States she spoke with the MFM who told her there was an increased risk of stillbirth in varix widths greater than 9mm. She asked him how large her baby's was and he said it was 9.34mm.  This scares her and she wonders why they aren't delivering her since this is above 9mm.          She is concerned about waiting until 37 weeks for her to be delivered. States "I would rather have a NICU baby than go to his funeral".            Has an appointment with Dr Dion Body Tuesday, but was told "by the other doctor who called her" that Dr Dion Body said she would call the patient Monday morning.  Has her next ultrasound Tuesday.  RN Note: C/o decreased fetal movement for past week; c/o ?SROM @ 2100 last night; being seen in high risk clinic at the hospital due to a cord issue with the baby;          Past Medical History: Past Medical History  Diagnosis Date  . Anxiety   . Previous cesarean delivery, antepartum condition or complication 04/01/2011  . History of kidney stones     x 4, first time age 55yo  . Anemia     with pregnancy  . Chronic headaches     began late 2012  . GERD (gastroesophageal reflux disease)   . Seasonal allergic rhinitis   . Depression     Past obstetric history: OB History  Gravida Para Term Preterm AB SAB TAB Ectopic Multiple Living  # Outcome Date GA Lbr Len/2nd Weight Sex Delivery Anes PTL Lv  3  Current           2 Term           1 Preterm     F CS-LTranv Spinal Y Y     Comments: C/S due to breech      Past Surgical History: Past Surgical History  Procedure Laterality Date  . Cesarean section    . Tonsillectomy      Family History: Family History  Problem Relation Age of Onset  . Hypertension Mother   . Cancer Mother 61    colon  . Diabetes Mother     borderline  . Thyroid disease Mother   . Depression Mother   . Anxiety disorder Mother   . Hypertension Maternal Grandfather   . Heart disease Maternal Grandfather   . Other Father     unknown  . Cancer Father     brain cancer?  . ADD / ADHD Sister   . Heart disease Maternal Uncle   . Diabetes Maternal Grandmother   . Stroke Neg Hx   . Cancer Other     others on maternal side    Social History: Social History  Substance Use Topics  .  Smoking status: Current Every Day Smoker -- 0.25 packs/day for 7 years    Types: Cigarettes  . Smokeless tobacco: Never Used     Comment: e-cigarette, trying to quit  . Alcohol Use: No    Allergies: No Known Allergies  Meds:  Prescriptions prior to admission  Medication Sig Dispense Refill Last Dose  . Prenatal Vit-Fe Fumarate-FA (PRENATAL MULTIVITAMIN) TABS tablet Take 1 tablet by mouth daily.   08/08/2015 at Unknown time  . calcium carbonate (TUMS - DOSED IN MG ELEMENTAL CALCIUM) 500 MG chewable tablet Chew 1-2 tablets by mouth as needed for indigestion or heartburn.    prn  . NIFEdipine (PROCARDIA) 10 MG capsule Take 1 capsule (10 mg total) by mouth every 6 (six) hours. (Patient not taking: Reported on 08/04/2015) 60 capsule 1 Not Taking at Unknown time  . pantoprazole (PROTONIX) 40 MG tablet Take 1 tablet (40 mg total) by mouth daily. (Patient not taking: Reported on 08/02/2015) 30 tablet 1 Not Taking at Unknown time   ROS:  Review of Systems  Constitutional: Negative for fever, chills and fatigue.  Respiratory: Negative for shortness of breath.   Gastrointestinal:  Negative for nausea, vomiting, abdominal pain (feels pressure and has sharp pains in pelvis at times), diarrhea and constipation.  Genitourinary: Positive for vaginal discharge. Negative for vaginal bleeding and difficulty urinating.  Musculoskeletal: Negative for back pain.  Neurological: Negative for dizziness.   I have reviewed patient's Past Medical Hx, Surgical Hx, Family Hx, Social Hx, medications and allergies.   Physical Exam  Patient Vitals for the past 24 hrs:  BP Temp Temp src Pulse Resp  08/09/15 1641 116/72 mmHg - - 79 16  08/09/15 1451 116/65 mmHg 98.4 F (36.9 C) Oral 90 16   Constitutional: Well-developed, well-nourished female in no acute distress.  Cardiovascular: normal rate Respiratory: normal effort, nor shortness of breath GI: Abd soft, non-tender, gravid appropriate for gestational age.  MS: Extremities nontender, no edema, normal ROM Neurologic: Alert and oriented x 4.  GU: Neg CVAT.  Dilation: 2 Effacement (%): 50 Cervical Position: Anterior Station: -2 Presentation: Vertex Exam by:: Morrison Old RN  FHT:  Baseline 130-140 , moderate variability, accelerations present to 150-170, no decelerations Contractions: q 1-2 mins, lasting 30 seconds, irritability pattern   Labs: Results for orders placed or performed during the hospital encounter of 08/09/15 (from the past 24 hour(s))  Urinalysis, Routine w reflex microscopic (not at [facility])     Status: Abnormal   Collection Time: 08/09/15  2:40 PM  Result Value Ref Range   Color, Urine YELLOW YELLOW   APPearance CLEAR CLEAR   Specific Gravity, Urine 1.025 1.005 - 1.030   pH 6.0 5.0 - 8.0   Glucose, UA NEGATIVE NEGATIVE mg/dL   Hgb urine dipstick TRACE (A) NEGATIVE   Bilirubin Urine NEGATIVE NEGATIVE   Ketones, ur NEGATIVE NEGATIVE mg/dL   Protein, ur NEGATIVE NEGATIVE mg/dL   Nitrite NEGATIVE NEGATIVE   Leukocytes, UA TRACE (A) NEGATIVE  Urine microscopic-add on     Status: Abnormal   Collection Time:  08/09/15  2:40 PM  Result Value Ref Range   Squamous Epithelial / LPF 0-5 (A) NONE SEEN   WBC, UA 0-5 0 - 5 WBC/hpf   RBC / HPF 0-5 0 - 5 RBC/hpf   Bacteria, UA FEW (A) NONE SEEN   Urine-Other MUCOUS PRESENT   Amnisure rupture of membrane (rom)not at [facility]     Status: None   Collection Time: 08/09/15  3:35 PM  Result Value Ref Range   Amnisure ROM NEGATIVE    --/--/O POS (11/09 0110)  Imaging:   08/07/15 MFM:   Varix measures 9.259mm, stable, no filling defects or turbulence, BPP 8/8  MAU Course/MDM: I have ordered labs and reviewed results.  Consult Dr Estanislado Pandyivard with presentation, exam findings and test results.  Nonstress test showed reactive Fetal Heart rate tracing  Pt stable at time of discharge.  Assessment: 1. Choroid plexus cyst of fetus, fetus 1     Plan: Discharge home Labor precautions and fetal kick counts Discussed good hydration may decrease irritability and improve perception of fetal movement Listened to concerns of patient and her mother for a long time. Stressed that Dr Estanislado Pandyivard, Dr Dion BodyVarnado and the MFM doctors are working together to provide the best care possible. Reviewed the recommendations of MFM, which patient is familiar with but is still nervous about. I stressed the need for her to discuss her concerns with Dr Dion BodyVarnado in the morning.  If she does not get a call from her, then call the office. If she has continued concerns about decreased movement tonight, she may return for monitoring, though her reactive tracing and BPP results are reassuring now. Reiterated that I cannot make any decisions about her delivery plan and that concerns should be directed to her doctor.   Instructed to return for labor contractions, leaking of fluid, bleeding or decreased movement.     Medication List    ASK your doctor about these medications        calcium carbonate 500 MG chewable tablet  Commonly known as:  TUMS - dosed in mg elemental calcium  Chew 1-2 tablets by mouth as  needed for indigestion or heartburn.     NIFEdipine 10 MG capsule  Commonly known as:  PROCARDIA  Take 1 capsule (10 mg total) by mouth every 6 (six) hours.     pantoprazole 40 MG tablet  Commonly known as:  PROTONIX  Take 1 tablet (40 mg total) by mouth daily.     prenatal multivitamin Tabs tablet  Take 1 tablet by mouth daily.        Wynelle BourgeoisMarie Harriett Azar CNM, MSN Certified Nurse-Midwife 08/09/2015 4:42 PM

## 2015-08-09 NOTE — Discharge Instructions (Signed)
Fetal Movement Counts °Patient Name: __________________________________________________ Patient Due Date: ____________________ °Performing a fetal movement count is highly recommended in high-risk pregnancies, but it is good for every pregnant woman to do. Your health care provider may ask you to start counting fetal movements at 28 weeks of the pregnancy. Fetal movements often increase: °· After eating a full meal. °· After physical activity. °· After eating or drinking something sweet or cold. °· At rest. °Pay attention to when you feel the baby is most active. This will help you notice a pattern of your baby's sleep and wake cycles and what factors contribute to an increase in fetal movement. It is important to perform a fetal movement count at the same time each day when your baby is normally most active.  °HOW TO COUNT FETAL MOVEMENTS °1. Find a quiet and comfortable area to sit or lie down on your left side. Lying on your left side provides the best blood and oxygen circulation to your baby. °2. Write down the day and time on a sheet of paper or in a journal. °3. Start counting kicks, flutters, swishes, rolls, or jabs in a 2-hour period. You should feel at least 10 movements within 2 hours. °4. If you do not feel 10 movements in 2 hours, wait 2-3 hours and count again. Look for a change in the pattern or not enough counts in 2 hours. °SEEK MEDICAL CARE IF: °· You feel less than 10 counts in 2 hours, tried twice. °· There is no movement in over an hour. °· The pattern is changing or taking longer each day to reach 10 counts in 2 hours. °· You feel the baby is not moving as he or she usually does. °Date: ____________ Movements: ____________ Start time: ____________ Finish time: ____________  °Date: ____________ Movements: ____________ Start time: ____________ Finish time: ____________ °Date: ____________ Movements: ____________ Start time: ____________ Finish time: ____________ °Date: ____________ Movements:  ____________ Start time: ____________ Finish time: ____________ °Date: ____________ Movements: ____________ Start time: ____________ Finish time: ____________ °Date: ____________ Movements: ____________ Start time: ____________ Finish time: ____________ °Date: ____________ Movements: ____________ Start time: ____________ Finish time: ____________ °Date: ____________ Movements: ____________ Start time: ____________ Finish time: ____________  °Date: ____________ Movements: ____________ Start time: ____________ Finish time: ____________ °Date: ____________ Movements: ____________ Start time: ____________ Finish time: ____________ °Date: ____________ Movements: ____________ Start time: ____________ Finish time: ____________ °Date: ____________ Movements: ____________ Start time: ____________ Finish time: ____________ °Date: ____________ Movements: ____________ Start time: ____________ Finish time: ____________ °Date: ____________ Movements: ____________ Start time: ____________ Finish time: ____________ °Date: ____________ Movements: ____________ Start time: ____________ Finish time: ____________  °Date: ____________ Movements: ____________ Start time: ____________ Finish time: ____________ °Date: ____________ Movements: ____________ Start time: ____________ Finish time: ____________ °Date: ____________ Movements: ____________ Start time: ____________ Finish time: ____________ °Date: ____________ Movements: ____________ Start time: ____________ Finish time: ____________ °Date: ____________ Movements: ____________ Start time: ____________ Finish time: ____________ °Date: ____________ Movements: ____________ Start time: ____________ Finish time: ____________ °Date: ____________ Movements: ____________ Start time: ____________ Finish time: ____________  °Date: ____________ Movements: ____________ Start time: ____________ Finish time: ____________ °Date: ____________ Movements: ____________ Start time: ____________ Finish  time: ____________ °Date: ____________ Movements: ____________ Start time: ____________ Finish time: ____________ °Date: ____________ Movements: ____________ Start time: ____________ Finish time: ____________ °Date: ____________ Movements: ____________ Start time: ____________ Finish time: ____________ °Date: ____________ Movements: ____________ Start time: ____________ Finish time: ____________ °Date: ____________ Movements: ____________ Start time: ____________ Finish time: ____________  °Date: ____________ Movements: ____________ Start time: ____________ Finish   time: ____________ °Date: ____________ Movements: ____________ Start time: ____________ Finish time: ____________ °Date: ____________ Movements: ____________ Start time: ____________ Finish time: ____________ °Date: ____________ Movements: ____________ Start time: ____________ Finish time: ____________ °Date: ____________ Movements: ____________ Start time: ____________ Finish time: ____________ °Date: ____________ Movements: ____________ Start time: ____________ Finish time: ____________ °Date: ____________ Movements: ____________ Start time: ____________ Finish time: ____________  °Date: ____________ Movements: ____________ Start time: ____________ Finish time: ____________ °Date: ____________ Movements: ____________ Start time: ____________ Finish time: ____________ °Date: ____________ Movements: ____________ Start time: ____________ Finish time: ____________ °Date: ____________ Movements: ____________ Start time: ____________ Finish time: ____________ °Date: ____________ Movements: ____________ Start time: ____________ Finish time: ____________ °Date: ____________ Movements: ____________ Start time: ____________ Finish time: ____________ °Date: ____________ Movements: ____________ Start time: ____________ Finish time: ____________  °Date: ____________ Movements: ____________ Start time: ____________ Finish time: ____________ °Date: ____________  Movements: ____________ Start time: ____________ Finish time: ____________ °Date: ____________ Movements: ____________ Start time: ____________ Finish time: ____________ °Date: ____________ Movements: ____________ Start time: ____________ Finish time: ____________ °Date: ____________ Movements: ____________ Start time: ____________ Finish time: ____________ °Date: ____________ Movements: ____________ Start time: ____________ Finish time: ____________ °Date: ____________ Movements: ____________ Start time: ____________ Finish time: ____________  °Date: ____________ Movements: ____________ Start time: ____________ Finish time: ____________ °Date: ____________ Movements: ____________ Start time: ____________ Finish time: ____________ °Date: ____________ Movements: ____________ Start time: ____________ Finish time: ____________ °Date: ____________ Movements: ____________ Start time: ____________ Finish time: ____________ °Date: ____________ Movements: ____________ Start time: ____________ Finish time: ____________ °Date: ____________ Movements: ____________ Start time: ____________ Finish time: ____________ °  °This information is not intended to replace advice given to you by your health care provider. Make sure you discuss any questions you have with your health care provider. °  °Document Released: 09/28/2006 Document Revised: 09/19/2014 Document Reviewed: 06/25/2012 °Elsevier Interactive Patient Education ©2016 [employer] °Third Trimester of Pregnancy °The third trimester is from week 29 through week 42, months 7 through 9. The third trimester is a time when the fetus is growing rapidly. At the end of the ninth month, the fetus is about 20 inches in length and weighs 6-10 pounds.  °BODY CHANGES °Your body goes through many changes during pregnancy. The changes vary from woman to woman.  °· Your weight will continue to increase. You can expect to gain 25-35 pounds (11-16 kg) by the end of the pregnancy. °· You may  begin to get stretch marks on your hips, abdomen, and breasts. °· You may urinate more often because the fetus is moving lower into your pelvis and pressing on your bladder. °· You may develop or continue to have heartburn as a result of your pregnancy. °· You may develop constipation because certain hormones are causing the muscles that push waste through your intestines to slow down. °· You may develop hemorrhoids or swollen, bulging veins (varicose veins). °· You may have pelvic pain because of the weight gain and pregnancy hormones relaxing your joints between the bones in your pelvis. Backaches may result from overexertion of the muscles supporting your posture. °· You may have changes in your hair. These can include thickening of your hair, rapid growth, and changes in texture. Some women also have hair loss during or after pregnancy, or hair that feels dry or thin. Your hair will most likely return to normal after your baby is born. °· Your breasts will continue to grow and be tender. A yellow discharge may leak from your breasts called colostrum. °· Your belly button may stick out. °·   You may feel short of breath because of your expanding uterus. °· You may notice the fetus "dropping," or moving lower in your abdomen. °· You may have a bloody mucus discharge. This usually occurs a few days to a week before labor begins. °· Your cervix becomes thin and soft (effaced) near your due date. °WHAT TO EXPECT AT YOUR PRENATAL EXAMS  °You will have prenatal exams every 2 weeks until week 36. Then, you will have weekly prenatal exams. During a routine prenatal visit: °5. You will be weighed to make sure you and the fetus are growing normally. °6. Your blood pressure is taken. °7. Your abdomen will be measured to track your baby's growth. °8. The fetal heartbeat will be listened to. °9. Any test results from the previous visit will be discussed. °10. You may have a cervical check near your due date to see if you have  effaced. °At around 36 weeks, your caregiver will check your cervix. At the same time, your caregiver will also perform a test on the secretions of the vaginal tissue. This test is to determine if a type of bacteria, Group B streptococcus, is present. Your caregiver will explain this further. °Your caregiver may ask you: °· What your birth plan is. °· How you are feeling. °· If you are feeling the baby move. °· If you have had any abnormal symptoms, such as leaking fluid, bleeding, severe headaches, or abdominal cramping. °· If you are using any tobacco products, including cigarettes, chewing tobacco, and electronic cigarettes. °· If you have any questions. °Other tests or screenings that may be performed during your third trimester include: °· Blood tests that check for low iron levels (anemia). °· Fetal testing to check the health, activity level, and growth of the fetus. Testing is done if you have certain medical conditions or if there are problems during the pregnancy. °· HIV (human immunodeficiency virus) testing. If you are at high risk, you may be screened for HIV during your third trimester of pregnancy. °FALSE LABOR °You may feel small, irregular contractions that eventually go away. These are called Braxton Hicks contractions, or false labor. Contractions may last for hours, days, or even weeks before true labor sets in. If contractions come at regular intervals, intensify, or become painful, it is best to be seen by your caregiver.  °SIGNS OF LABOR  °· Menstrual-like cramps. °· Contractions that are 5 minutes apart or less. °· Contractions that start on the top of the uterus and spread down to the lower abdomen and back. °· A sense of increased pelvic pressure or back pain. °· A watery or bloody mucus discharge that comes from the vagina. °If you have any of these signs before the 37th week of pregnancy, call your caregiver right away. You need to go to the hospital to get checked immediately. °HOME CARE  INSTRUCTIONS  °· Avoid all smoking, herbs, alcohol, and unprescribed drugs. These chemicals affect the formation and growth of the baby. °· Do not use any tobacco products, including cigarettes, chewing tobacco, and electronic cigarettes. If you need help quitting, ask your health care provider. You may receive counseling support and other resources to help you quit. °· Follow your caregiver's instructions regarding medicine use. There are medicines that are either safe or unsafe to take during pregnancy. °· Exercise only as directed by your caregiver. Experiencing uterine cramps is a good sign to stop exercising. °· Continue to eat regular, healthy meals. °· Wear a good support bra for   breast tenderness. °· Do not use hot tubs, steam rooms, or saunas. °· Wear your seat belt at all times when driving. °· Avoid raw meat, uncooked cheese, cat litter boxes, and soil used by cats. These carry germs that can cause birth defects in the baby. °· Take your prenatal vitamins. °· Take 1500-2000 mg of calcium daily starting at the 20th week of pregnancy until you deliver your baby. °· Try taking a stool softener (if your caregiver approves) if you develop constipation. Eat more high-fiber foods, such as fresh vegetables or fruit and whole grains. Drink plenty of fluids to keep your urine clear or pale yellow. °· Take warm sitz baths to soothe any pain or discomfort caused by hemorrhoids. Use hemorrhoid cream if your caregiver approves. °· If you develop varicose veins, wear support hose. Elevate your feet for 15 minutes, 3-4 times a day. Limit salt in your diet. °· Avoid heavy lifting, wear low heal shoes, and practice good posture. °· Rest a lot with your legs elevated if you have leg cramps or low back pain. °· Visit your dentist if you have not gone during your pregnancy. Use a soft toothbrush to brush your teeth and be gentle when you floss. °· A sexual relationship may be continued unless your caregiver directs you  otherwise. °· Do not travel far distances unless it is absolutely necessary and only with the approval of your caregiver. °· Take prenatal classes to understand, practice, and ask questions about the labor and delivery. °· Make a trial run to the hospital. °· Pack your hospital bag. °· Prepare the baby's nursery. °· Continue to go to all your prenatal visits as directed by your caregiver. °SEEK MEDICAL CARE IF: °· You are unsure if you are in labor or if your water has broken. °· You have dizziness. °· You have mild pelvic cramps, pelvic pressure, or nagging pain in your abdominal area. °· You have persistent nausea, vomiting, or diarrhea. °· You have a bad smelling vaginal discharge. °· You have pain with urination. °SEEK IMMEDIATE MEDICAL CARE IF:  °· You have a fever. °· You are leaking fluid from your vagina. °· You have spotting or bleeding from your vagina. °· You have severe abdominal cramping or pain. °· You have rapid weight loss or gain. °· You have shortness of breath with chest pain. °· You notice sudden or extreme swelling of your face, hands, ankles, feet, or legs. °· You have not felt your baby move in over an hour. °· You have severe headaches that do not go away with medicine. °· You have vision changes. °  °This information is not intended to replace advice given to you by your health care provider. Make sure you discuss any questions you have with your health care provider. °  °Document Released: 08/23/2001 Document Revised: 09/19/2014 Document Reviewed: 10/30/2012 °Elsevier Interactive Patient Education ©2016 [employer] Inc[employer] ° °

## 2015-08-09 NOTE — MAU Note (Addendum)
C/o decreased fetal movement for past week; c/o ?SROM @ 2100 last night; being seen in high risk clinic at the hospital due to a cord issue with the baby;

## 2015-08-11 ENCOUNTER — Encounter (HOSPITAL_COMMUNITY): Payer: Self-pay

## 2015-08-11 ENCOUNTER — Inpatient Hospital Stay (HOSPITAL_COMMUNITY)
Admission: AD | Admit: 2015-08-11 | Discharge: 2015-08-11 | Disposition: A | Discharge status: Home / Self Care | Payer: Medicaid Other | Source: Ambulatory Visit | Attending: Obstetrics and Gynecology | Admitting: Obstetrics and Gynecology

## 2015-08-11 ENCOUNTER — Ambulatory Visit (HOSPITAL_COMMUNITY)
Admission: RE | Admit: 2015-08-11 | Discharge: 2015-08-11 | Disposition: A | Discharge status: Home / Self Care | Payer: Medicaid Other | Source: Ambulatory Visit | Attending: Obstetrics and Gynecology | Admitting: Obstetrics and Gynecology

## 2015-08-11 ENCOUNTER — Other Ambulatory Visit (HOSPITAL_COMMUNITY): Payer: Self-pay | Admitting: Maternal and Fetal Medicine

## 2015-08-11 ENCOUNTER — Encounter (HOSPITAL_COMMUNITY): Payer: Self-pay | Admitting: *Deleted

## 2015-08-11 ENCOUNTER — Telehealth (HOSPITAL_COMMUNITY): Payer: Self-pay | Admitting: *Deleted

## 2015-08-11 ENCOUNTER — Other Ambulatory Visit (HOSPITAL_COMMUNITY): Payer: Medicaid Other

## 2015-08-11 DIAGNOSIS — O368130 Decreased fetal movements, third trimester, not applicable or unspecified: Secondary | ICD-10-CM | POA: Insufficient documentation

## 2015-08-11 DIAGNOSIS — O99333 Smoking (tobacco) complicating pregnancy, third trimester: Secondary | ICD-10-CM | POA: Insufficient documentation

## 2015-08-11 DIAGNOSIS — O368190 Decreased fetal movements, unspecified trimester, not applicable or unspecified: Secondary | ICD-10-CM

## 2015-08-11 DIAGNOSIS — Z8751 Personal history of pre-term labor: Secondary | ICD-10-CM

## 2015-08-11 DIAGNOSIS — O471 False labor at or after 37 completed weeks of gestation: Secondary | ICD-10-CM

## 2015-08-11 DIAGNOSIS — Z3A36 36 weeks gestation of pregnancy: Secondary | ICD-10-CM | POA: Diagnosis not present

## 2015-08-11 DIAGNOSIS — O4703 False labor before 37 completed weeks of gestation, third trimester: Secondary | ICD-10-CM | POA: Insufficient documentation

## 2015-08-11 DIAGNOSIS — N898 Other specified noninflammatory disorders of vagina: Secondary | ICD-10-CM

## 2015-08-11 DIAGNOSIS — O26893 Other specified pregnancy related conditions, third trimester: Secondary | ICD-10-CM

## 2015-08-11 DIAGNOSIS — F17210 Nicotine dependence, cigarettes, uncomplicated: Secondary | ICD-10-CM | POA: Diagnosis not present

## 2015-08-11 DIAGNOSIS — O358XX0 Maternal care for other (suspected) fetal abnormality and damage, not applicable or unspecified: Secondary | ICD-10-CM

## 2015-08-11 LAB — POCT FERN TEST: POCT Fern Test: NEGATIVE

## 2015-08-11 MED ORDER — FAMOTIDINE 20 MG PO TABS
20.0000 mg | ORAL_TABLET | Freq: Once | ORAL | Status: AC
  Administered 2015-08-11: 20 mg via ORAL
  Filled 2015-08-11: qty 1

## 2015-08-11 NOTE — Telephone Encounter (Signed)
Preadmission screen  

## 2015-08-11 NOTE — MAU Note (Signed)
Pt presents complaining of contractions that are "close together and hurting." States she had two appointments this am where she changed from 3.5cm to 4cm and the dr wanted her monitored and checked again. Pt also states that the baby's heart rate changed today from 140s to 120s and states her dr was concerned.

## 2015-08-11 NOTE — Discharge Instructions (Signed)
Braxton Hicks Contractions °Contractions of the uterus can occur throughout pregnancy. Contractions are not always a sign that you are in labor.  °WHAT ARE BRAXTON HICKS CONTRACTIONS?  °Contractions that occur before labor are called Braxton Hicks contractions, or false labor. Toward the end of pregnancy (32-34 weeks), these contractions can develop more often and may become more forceful. This is not true labor because these contractions do not result in opening (dilatation) and thinning of the cervix. They are sometimes difficult to tell apart from true labor because these contractions can be forceful and people have different pain tolerances. You should not feel embarrassed if you go to the hospital with false labor. Sometimes, the only way to tell if you are in true labor is for your health care provider to look for changes in the cervix. °If there are no prenatal problems or other health problems associated with the pregnancy, it is completely safe to be sent home with false labor and await the onset of true labor. °HOW CAN YOU TELL THE DIFFERENCE BETWEEN TRUE AND FALSE LABOR? °False Labor °· The contractions of false labor are usually shorter and not as hard as those of true labor.   °· The contractions are usually irregular.   °· The contractions are often felt in the front of the lower abdomen and in the groin.   °· The contractions may go away when you walk around or change positions while lying down.   °· The contractions get weaker and are shorter lasting as time goes on.   °· The contractions do not usually become progressively stronger, regular, and closer together as with true labor.   °True Labor °· Contractions in true labor last 30-70 seconds, become very regular, usually become more intense, and increase in frequency.   °· The contractions do not go away with walking.   °· The discomfort is usually felt in the top of the uterus and spreads to the lower abdomen and low back.   °· True labor can be  determined by your health care provider with an exam. This will show that the cervix is dilating and getting thinner.   °WHAT TO REMEMBER °· Keep up with your usual exercises and follow other instructions given by your health care provider.   °· Take medicines as directed by your health care provider.   °· Keep your regular prenatal appointments.   °· Eat and drink lightly if you think you are going into labor.   °· If Braxton Hicks contractions are making you uncomfortable:   °¨ Change your position from lying down or resting to walking, or from walking to resting.   °¨ Sit and rest in a tub of warm water.   °¨ Drink 2-3 glasses of water. Dehydration may cause these contractions.   °¨ Do slow and deep breathing several times an hour.   °WHEN SHOULD I SEEK IMMEDIATE MEDICAL CARE? °Seek immediate medical care if: °· Your contractions become stronger, more regular, and closer together.   °· You have fluid leaking or gushing from your vagina.   °· You have a fever.   °· You pass blood-tinged mucus.   °· You have vaginal bleeding.   °· You have continuous abdominal pain.   °· You have low back pain that you never had before.   °· You feel your baby's head pushing down and causing pelvic pressure.   °· Your baby is not moving as much as it used to.   °  °This information is not intended to replace advice given to you by your health care provider. Make sure you discuss any questions you have with your health care   provider. °  °Document Released: 08/29/2005 Document Revised: 09/03/2013 Document Reviewed: 06/10/2013 °Elsevier Interactive Patient Education ©2016 [employer] ° °

## 2015-08-11 NOTE — MAU Provider Note (Signed)
Chief Complaint:  Laboring   First Provider Initiated Contact with Patient 08/11/15 1626     HPI: [name] is a 24 y.o. [id number] at [redacted]w[redacted]d who presents to maternity admissions reporting increased contractions and states MD was concerned at the office when FHR was varying from 120's to 140's. While in MAU she had an episode of leaking that she thinks is stress incontinence from laughing. Was 4 cm in office.   Denies vaginal bleeding. Good fetal movement.   Patient Active Problem List   Diagnosis Date Noted  . 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 Preterm labor in third trimester without delivery 07/22/2015  . Choroid plexus cyst of fetus 04/30/2015  . ADHD (attention deficit hyperactivity disorder), inattentive type 11/11/2014  . Depression 08/01/2012  . Tobacco use disorder 12/15/2011  . Anxiety 12/15/2011  . Constipation 12/15/2011  . Chronic headache 12/15/2011  . VBAC, delivered, current hospitalization 04/03/2011     Past Medical History: Past Medical History  Diagnosis Date  . Anxiety   . Previous cesarean delivery, antepartum condition or complication 04/01/2011  . History of kidney stones     x 4, first time age 65yo  . Anemia     with pregnancy  . Chronic headaches     began late 2012  . GERD (gastroesophageal reflux disease)   . Seasonal allergic rhinitis   . Depression     Past obstetric history: OB History  Gravida Para Term Preterm AB SAB TAB Ectopic Multiple Living  # Outcome Date GA Lbr Len/2nd Weight Sex Delivery Anes PTL Lv  3 Current           2 Term 2012 [redacted]w[redacted]d  7 lb 1 oz (3.204 kg) F Vag-Spont Spinal  Y  1 Preterm 2010 [redacted]w[redacted]d  5 lb 3 oz (2.353 kg) F CS-LTranv Spinal Y Y     Comments: C/S due to breech      Past Surgical History: Past Surgical History  Procedure Laterality Date  . Cesarean section    . Tonsillectomy       Family History: Family History  Problem Relation Age of  Onset  . Hypertension Mother   . Cancer Mother 90    colon  . Diabetes Mother     borderline  . Thyroid disease Mother   . Depression Mother   . Anxiety disorder Mother   . Hypertension Maternal Grandfather   . Heart disease Maternal Grandfather   . Other Father     unknown  . Cancer Father     brain cancer?  . ADD / ADHD Sister   . Heart disease Maternal Uncle   . Diabetes Maternal Grandmother   . Stroke Neg Hx   . Cancer Other     others on maternal side  . Heart murmur Daughter     Social History: Social History  Substance Use Topics  . Smoking status: Current Every Day Smoker -- 0.25 packs/day for 7 years    Types: Cigarettes  . Smokeless tobacco: Never Used     Comment: e-cigarette, trying to quit  . Alcohol Use: No    Allergies: No Known Allergies  Meds:  Prescriptions prior to admission  Medication Sig Dispense Refill Last Dose  . calcium carbonate (TUMS - DOSED IN MG ELEMENTAL CALCIUM) 500 MG chewable tablet Chew 1-2 tablets by mouth as needed for indigestion or heartburn.  Past Month at Unknown time  . Prenatal Vit-Fe Fumarate-FA (PRENATAL MULTIVITAMIN) TABS tablet Take 1 tablet by mouth daily.   08/10/2015 at Unknown time    I have reviewed patient's Past Medical Hx, Surgical Hx, Family Hx, Social Hx, medications and allergies.   ROS:  Review of Systems  Constitutional: Negative for fever and chills.  Gastrointestinal: Positive for abdominal pain (contractions only).  Genitourinary:       Possible leaking amniotic fluid versus urine    Physical Exam   Patient Vitals for the past 24 hrs:  BP Temp Temp src Pulse Resp  08/11/15 1516 113/67 mmHg 98 F (36.7 C) Oral 74 18   Constitutional: Well-developed, well-nourished female in no acute distress.  Cardiovascular: normal rate Respiratory: normal effort GI: Abd soft, non-tender, gravid appropriate for gestational age.  MS: Extremities nontender, no edema, normal ROM Neurologic: Alert and  oriented x 4.  GU: Neg CVAT.  Pelvic: NEFG, small amount of mucoid discharge, neg pooling, no blood, cervix clean.   Dilation: 4 Effacement (%): 50 Cervical Position: Middle Station: -2 Presentation: Vertex Exam by:: Sarajane MarekS. Carrera, RNC  FHT:  Baseline 125 , moderate variability, accelerations present, no decelerations Contractions: q 3-7 mins, mild   Labs: Results for orders placed or performed during the hospital encounter of 08/11/15 (from the past 24 hour(s))  Fern Test     Status: Normal   Collection Time: 08/11/15  4:23 PM  Result Value Ref Range   POCT Fern Test Negative = intact amniotic membranes     Imaging:  NA  MAU Course: NST, SSE, fern  No cervical change  MDM: -Contractions w/out cervical change. No evidence of active labor -FHR reactive.  -Intact membranes. Leaking lost likely leakage of urine or mucus plug.  Assessment: 1. False labor after 37 completed weeks of gestation   2. Vaginal discharge during pregnancy in third trimester     Plan: Discharge home in stable condition.  Labor precautions and fetal kick counts     Follow-up Information    Follow up with Geryl RankinsVARNADO, EVELYN, MD.   Specialty:  Obstetrics and Gynecology   Why:  Routine prenatal visit   Contact information:   [address] [place] [address] [phone]       [facility]   Why:  As needed if symptoms worsen   Contact information:   [address] [id number] [place] [id number] [phone]        Medication List    TAKE these medications        calcium carbonate 500 MG chewable tablet  Commonly known as:  TUMS - dosed in mg elemental calcium  Chew 1-2 tablets by mouth as needed for indigestion or heartburn.     prenatal multivitamin Tabs tablet  Take 1 tablet by mouth daily.        [place] Carliyah Cotterman, [place] 08/11/2015 6:13 PM

## 2015-08-11 NOTE — Progress Notes (Signed)
Returned page. Report given regarding pt's unchanged cervical exam from office, reactive FHR tracing and contraction pattern. Dr. Dion BodyVarnado wants pt to walk for 1 hour and be rechecked. If no change, can discharge pt home. Requested medication for heartburn per pt. Order received for 20mg  pepcid

## 2015-08-11 NOTE — Progress Notes (Signed)
Notified of pt unchanged cervix. Will discharge pt home with labor precautions

## 2015-08-12 ENCOUNTER — Inpatient Hospital Stay (HOSPITAL_COMMUNITY)
Admission: RE | Admit: 2015-08-12 | Discharge: 2015-08-12 | Disposition: A | Discharge status: Home / Self Care | Payer: Medicaid Other | Source: Ambulatory Visit

## 2015-08-13 ENCOUNTER — Inpatient Hospital Stay (HOSPITAL_COMMUNITY)
Admission: RE | Admit: 2015-08-13 | Payer: Medicaid Other | Source: Ambulatory Visit | Admitting: Obstetrics and Gynecology

## 2015-08-13 ENCOUNTER — Other Ambulatory Visit: Payer: Self-pay | Admitting: Obstetrics and Gynecology

## 2015-08-13 SURGERY — Surgical Case
Anesthesia: Regional | Laterality: Bilateral

## 2015-08-13 NOTE — H&P (Addendum)
[name] is a 24 y.o. female G3 [phone]@ 36 4/7 weeks with h/o fetal umbilical vein varix.presents for IOL.  Pt has been followed by MFM due to increased risk of stillbirth.  Delivery at 37 weeks recommended. Prenatal complications as below. Pt has been on 17 P since 16 weeks.  Pt was admitted at 33 weeks due to PTL.  BMZ administered at ~30 weeks.  In the last 2 weeks pt has changed from 2 to 4 cm.  Pt has a h/o cesarean delivery with her first baby and a successful VBAC with her 2nd.  After extensivie counseling and consideration, pt desires VBAC.  She also desires PPBTL.  Medicaid papers previously signed. Maternal Medical History:  Contractions: Onset was 1 week ago.   Frequency: regular.   Perceived severity is moderate.    Fetal activity: Perceived fetal activity is normal.    Prenatal complications: Umbilical vein varix 9.9 cm. Bilateral choroid plexus cysts-resolved. H/o PT delivery on 3417 P.    OB History    Gravida Para Term Preterm AB TAB SAB Ectopic Multiple Living   3 2 1 1      2      Past Medical History  Diagnosis Date  . Anxiety   . Previous cesarean delivery, antepartum condition or complication 04/01/2011  . History of kidney stones     x 4, first time age 24yo  . Anemia     with pregnancy  . Chronic headaches     began late 2012  . GERD (gastroesophageal reflux disease)   . Seasonal allergic rhinitis   . Depression    Past Surgical History  Procedure Laterality Date  . Cesarean section    . Tonsillectomy     Family History: family history includes ADD / ADHD in her sister; Anxiety disorder in her mother; Cancer in her father and other; Cancer (age of onset: 4336) in her mother; Depression in her mother; Diabetes in her maternal grandmother and mother; Heart disease in her maternal grandfather and maternal uncle; Heart murmur in her daughter; Hypertension in her maternal grandfather and mother; Other in her father; Thyroid disease in her mother. There is no  history of Stroke. Social History:  reports that she has been smoking Cigarettes.  She has a 1.75 pack-year smoking history. She has never used smokeless tobacco. She reports that she does not drink alcohol or use illicit drugs.   Prenatal Transfer Tool  Maternal Diabetes: No Genetic Screening: Normal Maternal Ultrasounds/Referrals: Abnormal:  Findings:   Other: Fetal Ultrasounds or other Referrals:  Referred to Materal Fetal Medicine  Maternal Substance Abuse:  No Significant Maternal Medications:  Meds include: Progesterone Significant Maternal Lab Results:  Lab values include: Group B Strep negative Other Comments:  S/p BMZ, smoker  Review of Systems  Constitutional: Negative for fever and chills.  Gastrointestinal: Positive for abdominal pain.      Last menstrual period 12/03/2014. Maternal Exam:  Abdomen: Patient reports no abdominal tenderness. Surgical scars: low transverse.   Estimated fetal weight is 6 lbs.   Fetal presentation: vertex  Introitus: Normal vulva. Pelvis: adequate for delivery.   Cervix: Cervix evaluated by digital exam.     Fetal Exam Fetal Monitor Review: Mode: hand-held doppler probe.   Baseline rate: 120s.      Physical Exam  Constitutional: She is oriented to person, place, and time. She appears well-developed and well-nourished.  HENT:  Head: Normocephalic and atraumatic.  Respiratory: Effort normal.  GI: There is no tenderness.  Musculoskeletal: Normal range of motion. She exhibits no edema.  Neurological: She is alert and oriented to person, place, and time.  Skin: Skin is warm and dry.  Psychiatric: She has a normal mood and affect.    Prenatal labs: ABO, Rh: --/--/O POS (11/09 0110) Antibody: NEG (11/09 0110) Rubella: Immune (05/24 0000) RPR: Nonreactive (10/03 0000)  HBsAg: Negative (11/09 0224)  HIV: Non-reactive (11/09 0000)  GBS: Negative (11/09 0000)   Assessment/Plan: IUP @ 36 4/7 weeks Umbilical Vein Varix of  fetus H/o PT delivery on 17 P H/o Cesarean section x 1, VBAC x 1.  Desires VBAC Desires sterilization. Tobacco use.  IOL @ 37 weeks per MFM recommendations.  No contraindication to vaginal delivery per Dr. Sherrie George. Low dose Pitocin overnight then AROM in am. Pt counseled on risk of VBAC, consent to be signed on admission. Epidural per pt request.  IUPC with ROM. Postpartum BTL.   08/11/2015

## 2015-08-14 ENCOUNTER — Ambulatory Visit (HOSPITAL_COMMUNITY): Admission: RE | Admit: 2015-08-14 | Payer: Medicaid Other | Source: Ambulatory Visit

## 2015-08-14 ENCOUNTER — Encounter (HOSPITAL_COMMUNITY): Payer: Self-pay

## 2015-08-14 ENCOUNTER — Inpatient Hospital Stay (HOSPITAL_COMMUNITY)
Admission: RE | Admit: 2015-08-14 | Discharge: 2015-08-16 | DRG: 767 | Disposition: A | Discharge status: Home / Self Care | Payer: Medicaid Other | Source: Ambulatory Visit | Attending: Obstetrics and Gynecology | Admitting: Obstetrics and Gynecology

## 2015-08-14 ENCOUNTER — Inpatient Hospital Stay (HOSPITAL_COMMUNITY): Payer: Medicaid Other | Admitting: Anesthesiology

## 2015-08-14 ENCOUNTER — Encounter (HOSPITAL_COMMUNITY)
Admission: RE | Disposition: A | Discharge status: Home / Self Care | Payer: Self-pay | Source: Ambulatory Visit | Attending: Obstetrics and Gynecology

## 2015-08-14 ENCOUNTER — Encounter (HOSPITAL_COMMUNITY): Payer: Self-pay | Admitting: Anesthesiology

## 2015-08-14 ENCOUNTER — Other Ambulatory Visit (HOSPITAL_COMMUNITY): Payer: Medicaid Other

## 2015-08-14 DIAGNOSIS — K219 Gastro-esophageal reflux disease without esophagitis: Secondary | ICD-10-CM | POA: Diagnosis present

## 2015-08-14 DIAGNOSIS — O9962 Diseases of the digestive system complicating childbirth: Secondary | ICD-10-CM | POA: Diagnosis present

## 2015-08-14 DIAGNOSIS — O99334 Smoking (tobacco) complicating childbirth: Secondary | ICD-10-CM | POA: Diagnosis present

## 2015-08-14 DIAGNOSIS — O34219 Maternal care for unspecified type scar from previous cesarean delivery: Secondary | ICD-10-CM | POA: Diagnosis present

## 2015-08-14 DIAGNOSIS — Z3A37 37 weeks gestation of pregnancy: Secondary | ICD-10-CM

## 2015-08-14 DIAGNOSIS — O359XX0 Maternal care for (suspected) fetal abnormality and damage, unspecified, not applicable or unspecified: Secondary | ICD-10-CM | POA: Diagnosis present

## 2015-08-14 DIAGNOSIS — Z302 Encounter for sterilization: Secondary | ICD-10-CM

## 2015-08-14 HISTORY — PX: TUBAL LIGATION: SHX77

## 2015-08-14 LAB — TYPE AND SCREEN
ABO/RH(D): O POS
ANTIBODY SCREEN: NEGATIVE

## 2015-08-14 LAB — CBC
HEMATOCRIT: 36.5 % (ref 36.0–46.0)
HEMOGLOBIN: 12.6 g/dL (ref 12.0–15.0)
MCH: 32.1 pg (ref 26.0–34.0)
MCHC: 34.5 g/dL (ref 30.0–36.0)
MCV: 93.1 fL (ref 78.0–100.0)
Platelets: 287 10*3/uL (ref 150–400)
RBC: 3.92 MIL/uL (ref 3.87–5.11)
RDW: 13.6 % (ref 11.5–15.5)
WBC: 11.7 10*3/uL — AB (ref 4.0–10.5)

## 2015-08-14 LAB — RPR: RPR Ser Ql: NONREACTIVE

## 2015-08-14 SURGERY — LIGATION, FALLOPIAN TUBE, POSTPARTUM
Anesthesia: Spinal | Site: Abdomen | Laterality: Bilateral

## 2015-08-14 MED ORDER — KETOROLAC TROMETHAMINE 30 MG/ML IJ SOLN
INTRAMUSCULAR | Status: AC
  Filled 2015-08-14: qty 1

## 2015-08-14 MED ORDER — ACETAMINOPHEN 325 MG PO TABS: 650.0000 mg | ORAL_TABLET | ORAL | Status: DC | PRN

## 2015-08-14 MED ORDER — TETANUS-DIPHTH-ACELL PERTUSSIS 5-2.5-18.5 LF-MCG/0.5 IM SUSP: 0.5000 mL | Freq: Once | INTRAMUSCULAR | Status: DC

## 2015-08-14 MED ORDER — FAMOTIDINE 20 MG PO TABS
40.0000 mg | ORAL_TABLET | Freq: Once | ORAL | Status: AC
  Administered 2015-08-14: 40 mg via ORAL
  Filled 2015-08-14: qty 2

## 2015-08-14 MED ORDER — DEXAMETHASONE SODIUM PHOSPHATE 4 MG/ML IJ SOLN
INTRAMUSCULAR | Status: AC
  Filled 2015-08-14: qty 1

## 2015-08-14 MED ORDER — DIBUCAINE 1 % RE OINT
1.0000 "application " | TOPICAL_OINTMENT | RECTAL | Status: DC | PRN
  Filled 2015-08-14: qty 28

## 2015-08-14 MED ORDER — FENTANYL CITRATE (PF) 250 MCG/5ML IJ SOLN
INTRAMUSCULAR | Status: AC
  Filled 2015-08-14: qty 5

## 2015-08-14 MED ORDER — IBUPROFEN 600 MG PO TABS
600.0000 mg | ORAL_TABLET | Freq: Four times a day (QID) | ORAL | Status: DC
  Administered 2015-08-14 – 2015-08-16 (×8): 600 mg via ORAL
  Filled 2015-08-14 (×8): qty 1

## 2015-08-14 MED ORDER — SIMETHICONE 80 MG PO CHEW
80.0000 mg | CHEWABLE_TABLET | ORAL | Status: DC | PRN
  Administered 2015-08-14 – 2015-08-16 (×4): 80 mg via ORAL
  Filled 2015-08-14 (×4): qty 1

## 2015-08-14 MED ORDER — CITRIC ACID-SODIUM CITRATE 334-500 MG/5ML PO SOLN: 30.0000 mL | ORAL | Status: DC | PRN

## 2015-08-14 MED ORDER — LACTATED RINGERS IV SOLN
INTRAVENOUS | Status: DC
  Administered 2015-08-14 (×3): via INTRAVENOUS

## 2015-08-14 MED ORDER — KETOROLAC TROMETHAMINE 30 MG/ML IJ SOLN
INTRAMUSCULAR | Status: DC | PRN
  Administered 2015-08-14: 30 mg via INTRAVENOUS

## 2015-08-14 MED ORDER — OXYCODONE-ACETAMINOPHEN 5-325 MG PO TABS: 2.0000 | ORAL_TABLET | ORAL | Status: DC | PRN

## 2015-08-14 MED ORDER — DEXAMETHASONE SODIUM PHOSPHATE 4 MG/ML IJ SOLN
INTRAMUSCULAR | Status: DC | PRN
  Administered 2015-08-14: 4 mg via INTRAVENOUS

## 2015-08-14 MED ORDER — ONDANSETRON HCL 4 MG/2ML IJ SOLN
INTRAMUSCULAR | Status: AC
Start: 2015-08-14 — End: 2015-08-14
  Filled 2015-08-14: qty 2

## 2015-08-14 MED ORDER — ONDANSETRON HCL 4 MG/2ML IJ SOLN
4.0000 mg | INTRAMUSCULAR | Status: DC | PRN
  Administered 2015-08-14: 4 mg via INTRAVENOUS

## 2015-08-14 MED ORDER — BUPIVACAINE HCL (PF) 0.25 % IJ SOLN
INTRAMUSCULAR | Status: DC | PRN
  Administered 2015-08-14: 2 mL
  Administered 2015-08-14: 3 mL
  Administered 2015-08-14: 10 mL

## 2015-08-14 MED ORDER — METHYLERGONOVINE MALEATE 0.2 MG PO TABS: 0.2000 mg | ORAL_TABLET | ORAL | Status: DC | PRN

## 2015-08-14 MED ORDER — OXYTOCIN 40 UNITS IN LACTATED RINGERS INFUSION - SIMPLE MED
62.5000 mL/h | INTRAVENOUS | Status: DC
  Administered 2015-08-14: 62.5 mL/h via INTRAVENOUS

## 2015-08-14 MED ORDER — ONDANSETRON HCL 4 MG/2ML IJ SOLN: 4.0000 mg | Freq: Four times a day (QID) | INTRAMUSCULAR | Status: DC | PRN

## 2015-08-14 MED ORDER — BUPIVACAINE HCL (PF) 0.25 % IJ SOLN
INTRAMUSCULAR | Status: AC
  Filled 2015-08-14: qty 30

## 2015-08-14 MED ORDER — METHYLERGONOVINE MALEATE 0.2 MG/ML IJ SOLN: 0.2000 mg | INTRAMUSCULAR | Status: DC | PRN

## 2015-08-14 MED ORDER — ZOLPIDEM TARTRATE 5 MG PO TABS: 5.0000 mg | ORAL_TABLET | Freq: Every evening | ORAL | Status: DC | PRN

## 2015-08-14 MED ORDER — ONDANSETRON HCL 4 MG PO TABS: 4.0000 mg | ORAL_TABLET | ORAL | Status: DC | PRN

## 2015-08-14 MED ORDER — DIPHENHYDRAMINE HCL 50 MG/ML IJ SOLN: 12.5000 mg | INTRAMUSCULAR | Status: DC | PRN

## 2015-08-14 MED ORDER — MAGNESIUM HYDROXIDE 400 MG/5ML PO SUSP: 30.0000 mL | ORAL | Status: DC | PRN

## 2015-08-14 MED ORDER — EPHEDRINE 5 MG/ML INJ: 10.0000 mg | INTRAVENOUS | Status: DC | PRN

## 2015-08-14 MED ORDER — OXYTOCIN 40 UNITS IN LACTATED RINGERS INFUSION - SIMPLE MED: 62.5000 mL/h | INTRAVENOUS | Status: DC | PRN

## 2015-08-14 MED ORDER — SODIUM BICARBONATE 8.4 % IV SOLN
INTRAVENOUS | Status: AC
  Filled 2015-08-14: qty 50

## 2015-08-14 MED ORDER — PHENYLEPHRINE 40 MCG/ML (10ML) SYRINGE FOR IV PUSH (FOR BLOOD PRESSURE SUPPORT)
PREFILLED_SYRINGE | INTRAVENOUS | Status: AC
  Filled 2015-08-14: qty 20

## 2015-08-14 MED ORDER — FENTANYL CITRATE (PF) 100 MCG/2ML IJ SOLN
INTRAMUSCULAR | Status: DC | PRN
  Administered 2015-08-14 (×4): 50 ug via INTRAVENOUS

## 2015-08-14 MED ORDER — LANOLIN HYDROUS EX OINT: TOPICAL_OINTMENT | CUTANEOUS | Status: DC | PRN

## 2015-08-14 MED ORDER — TERBUTALINE SULFATE 1 MG/ML IJ SOLN: 0.2500 mg | Freq: Once | INTRAMUSCULAR | Status: DC | PRN

## 2015-08-14 MED ORDER — OXYTOCIN 40 UNITS IN LACTATED RINGERS INFUSION - SIMPLE MED
1.0000 m[IU]/min | INTRAVENOUS | Status: DC
  Administered 2015-08-14: 1 m[IU]/min via INTRAVENOUS
  Filled 2015-08-14: qty 1000

## 2015-08-14 MED ORDER — BUPIVACAINE IN DEXTROSE 0.75-8.25 % IT SOLN
INTRATHECAL | Status: DC | PRN
  Administered 2015-08-14: 1.6 mL via INTRATHECAL

## 2015-08-14 MED ORDER — LIDOCAINE HCL (PF) 1 % IJ SOLN: 30.0000 mL | INTRAMUSCULAR | Status: DC | PRN

## 2015-08-14 MED ORDER — FENTANYL 2.5 MCG/ML BUPIVACAINE 1/10 % EPIDURAL INFUSION (WH - ANES): 14.0000 mL/h | INTRAMUSCULAR | Status: DC | PRN

## 2015-08-14 MED ORDER — PHENYLEPHRINE 40 MCG/ML (10ML) SYRINGE FOR IV PUSH (FOR BLOOD PRESSURE SUPPORT): 80.0000 ug | PREFILLED_SYRINGE | INTRAVENOUS | Status: DC | PRN

## 2015-08-14 MED ORDER — OXYCODONE-ACETAMINOPHEN 5-325 MG PO TABS: 1.0000 | ORAL_TABLET | ORAL | Status: DC | PRN

## 2015-08-14 MED ORDER — WITCH HAZEL-GLYCERIN EX PADS
1.0000 "application " | MEDICATED_PAD | CUTANEOUS | Status: DC | PRN
  Administered 2015-08-14: 1 via TOPICAL

## 2015-08-14 MED ORDER — LIDOCAINE-EPINEPHRINE (PF) 2 %-1:200000 IJ SOLN
INTRAMUSCULAR | Status: AC
  Filled 2015-08-14: qty 20

## 2015-08-14 MED ORDER — METOCLOPRAMIDE HCL 10 MG PO TABS
10.0000 mg | ORAL_TABLET | Freq: Once | ORAL | Status: AC
  Administered 2015-08-14: 10 mg via ORAL
  Filled 2015-08-14: qty 1

## 2015-08-14 MED ORDER — LACTATED RINGERS IV SOLN
500.0000 mL | INTRAVENOUS | Status: DC | PRN
  Administered 2015-08-14: 500 mL via INTRAVENOUS

## 2015-08-14 MED ORDER — FENTANYL 2.5 MCG/ML BUPIVACAINE 1/10 % EPIDURAL INFUSION (WH - ANES)
INTRAMUSCULAR | Status: AC
  Filled 2015-08-14: qty 125

## 2015-08-14 MED ORDER — BENZOCAINE-MENTHOL 20-0.5 % EX AERO
1.0000 "application " | INHALATION_SPRAY | CUTANEOUS | Status: DC | PRN
  Administered 2015-08-14: 1 via TOPICAL
  Filled 2015-08-14 (×2): qty 56

## 2015-08-14 MED ORDER — BUTORPHANOL TARTRATE 1 MG/ML IJ SOLN
1.0000 mg | INTRAMUSCULAR | Status: DC | PRN
  Administered 2015-08-14: 1 mg via INTRAVENOUS
  Filled 2015-08-14: qty 1

## 2015-08-14 MED ORDER — OXYCODONE-ACETAMINOPHEN 5-325 MG PO TABS
1.0000 | ORAL_TABLET | ORAL | Status: DC | PRN
  Administered 2015-08-14 – 2015-08-16 (×6): 1 via ORAL
  Filled 2015-08-14 (×5): qty 1

## 2015-08-14 MED ORDER — DIPHENHYDRAMINE HCL 25 MG PO CAPS: 25.0000 mg | ORAL_CAPSULE | Freq: Four times a day (QID) | ORAL | Status: DC | PRN

## 2015-08-14 MED ORDER — PRENATAL MULTIVITAMIN CH
1.0000 | ORAL_TABLET | Freq: Every day | ORAL | Status: DC
  Administered 2015-08-15 – 2015-08-16 (×2): 1 via ORAL
  Filled 2015-08-14 (×2): qty 1

## 2015-08-14 MED ORDER — SENNOSIDES-DOCUSATE SODIUM 8.6-50 MG PO TABS
2.0000 | ORAL_TABLET | ORAL | Status: DC
  Administered 2015-08-15 – 2015-08-16 (×2): 2 via ORAL
  Filled 2015-08-14 (×2): qty 2

## 2015-08-14 MED ORDER — OXYTOCIN BOLUS FROM INFUSION: 500.0000 mL | INTRAVENOUS | Status: DC

## 2015-08-14 MED ORDER — OXYCODONE-ACETAMINOPHEN 5-325 MG PO TABS
2.0000 | ORAL_TABLET | ORAL | Status: DC | PRN
  Administered 2015-08-15 – 2015-08-16 (×2): 2 via ORAL
  Filled 2015-08-14 (×2): qty 2

## 2015-08-14 MED ORDER — LACTATED RINGERS IV SOLN
INTRAVENOUS | Status: DC
  Administered 2015-08-14: 125 mL/h via INTRAVENOUS
  Administered 2015-08-14: 08:00:00 via INTRAVENOUS

## 2015-08-14 SURGICAL SUPPLY — 20 items
CHLORAPREP W/TINT 26ML (MISCELLANEOUS) ×3 IMPLANT
CLOTH BEACON ORANGE TIMEOUT ST (SAFETY) ×3 IMPLANT
DRSG OPSITE POSTOP 3X4 (GAUZE/BANDAGES/DRESSINGS) ×3 IMPLANT
GLOVE BIO SURGEON STRL SZ7 (GLOVE) ×3 IMPLANT
GLOVE BIOGEL PI IND STRL 7.0 (GLOVE) ×2 IMPLANT
GLOVE BIOGEL PI INDICATOR 7.0 (GLOVE) ×4
GOWN STRL REUS W/TWL LRG LVL3 (GOWN DISPOSABLE) ×6 IMPLANT
LIQUID BAND (GAUZE/BANDAGES/DRESSINGS) ×3 IMPLANT
NEEDLE HYPO 22GX1.5 SAFETY (NEEDLE) ×3 IMPLANT
NS IRRIG 1000ML POUR BTL (IV SOLUTION) ×3 IMPLANT
PACK ABDOMINAL MINOR (CUSTOM PROCEDURE TRAY) ×3 IMPLANT
PENCIL BUTTON HOLSTER BLD 10FT (ELECTRODE) ×3 IMPLANT
SPONGE LAP 4X18 X RAY DECT (DISPOSABLE) ×3 IMPLANT
SUT MNCRL AB 4-0 PS2 18 (SUTURE) ×3 IMPLANT
SUT PLAIN 0 NONE (SUTURE) ×3 IMPLANT
SUT VIC AB 0 CT1 27 (SUTURE) ×4
SUT VIC AB 0 CT1 27XBRD ANBCTR (SUTURE) ×2 IMPLANT
SYR CONTROL 10ML LL (SYRINGE) ×3 IMPLANT
TOWEL OR 17X24 6PK STRL BLUE (TOWEL DISPOSABLE) ×6 IMPLANT
TRAY FOLEY CATH SILVER 14FR (SET/KITS/TRAYS/PACK) ×3 IMPLANT

## 2015-08-14 NOTE — Progress Notes (Signed)
Pt counseled on postpartum BTL.  Informed permanent, failure rate 1/200.  Medicaid consent previously signed. Consent signed. Clear liquids until 12 noon.  NPO at that time.

## 2015-08-14 NOTE — Anesthesia Postprocedure Evaluation (Signed)
Anesthesia Post Note  Patient: [name]  Procedure(s) Performed: Procedure(s) (LRB): POST PARTUM TUBAL LIGATION (Bilateral)  Patient location during evaluation: PACU Anesthesia Type: Spinal Level of consciousness: oriented and awake and alert Pain management: pain level controlled Vital Signs Assessment: post-procedure vital signs reviewed and stable Respiratory status: spontaneous breathing, respiratory function stable and nonlabored ventilation Cardiovascular status: blood pressure returned to baseline and stable Postop Assessment: no headache, no backache, spinal receding, patient able to bend at knees and no signs of nausea or vomiting Anesthetic complications: no    Last Vitals:  Filed Vitals:   08/14/15 1540 08/14/15 1745  BP: 90/47 96/54  Pulse: 68 61  Temp: 37 C 36.9 C  Resp: 18 16    Last Pain:  Filed Vitals:   08/14/15 1756  PainSc: 0-No pain                 Jasmon Graffam A.

## 2015-08-14 NOTE — Anesthesia Preprocedure Evaluation (Deleted)
Anesthesia Evaluation  Patient identified by MRN, date of birth, ID band Patient awake    Reviewed: Allergy & Precautions, NPO status , Patient's Chart, lab work & pertinent test results  Airway        Dental   Pulmonary Current Smoker,           Cardiovascular negative cardio ROS       Neuro/Psych  Headaches, PSYCHIATRIC DISORDERS Anxiety Depression    GI/Hepatic Neg liver ROS, GERD  ,  Endo/Other  negative endocrine ROS  Renal/GU negative Renal ROS  negative genitourinary   Musculoskeletal negative musculoskeletal ROS (+)   Abdominal   Peds negative pediatric ROS (+)  Hematology  (+) anemia ,   Anesthesia Other Findings   Reproductive/Obstetrics (+) Pregnancy                             Lab Results  Component Value Date   WBC 11.7* 08/14/2015   HGB 12.6 08/14/2015   HCT 36.5 08/14/2015   MCV 93.1 08/14/2015   PLT 287 08/14/2015   No results found for: INR, PROTIME   Anesthesia Physical Anesthesia Plan  ASA: II  Anesthesia Plan: Epidural   Post-op Pain Management:    Induction:   Airway Management Planned:   Additional Equipment:   Intra-op Plan:   Post-operative Plan:   Informed Consent: I have reviewed the patients History and Physical, chart, labs and discussed the procedure including the risks, benefits and alternatives for the proposed anesthesia with the patient or authorized representative who has indicated his/her understanding and acceptance.     Plan Discussed with:   Anesthesia Plan Comments:         Anesthesia Quick Evaluation

## 2015-08-14 NOTE — Progress Notes (Signed)
  Subjective: Reports pain w/ ctxs, but manageable. Trying to proceed w/o pain med/epidural.  Objective: BP 93/52 mmHg  Pulse 69  Temp(Src) 98.8 F (37.1 C) (Oral)  Resp 16  Ht 5\' 6"  (1.676 m)  Wt 72.576 kg (160 lb)  BMI 25.84 kg/m2  LMP 12/03/2014     Today's Vitals   08/14/15 0309 08/14/15 0415 08/14/15 0416 08/14/15 0501  BP:   93/52   Pulse:   69   Temp:  98.8 F (37.1 C)    TempSrc:  Oral    Resp:   16   Height:      Weight:      PainSc: 4  6   8     FHT: BL 130 w/ moderate variability, abundant accels, no decels UC:   irregular, every 2-3 minutes SVE:   Dilation: 4.5 Effacement (%): 70 Station: -1 Exam by:: Doloris HallJenny Middleton RN Pitocin at 5 mU/min at 04:18 AM  Assessment:  IOL due to h/o fetal umbilical vein varix Cat 1 FHRT GBS neg  Plan: Continue plan Dr. Dion BodyVarnado to assume care at 7 AM  [facility] CenterWILLIAMS, [facility] HospitalKIMBERLY CNM 08/14/2015, 5:50 AM

## 2015-08-14 NOTE — Progress Notes (Addendum)
Late entry  In to assess pt.  CCOB managed overnight. Pt tearful due to tension b/w family members.  Pt's father came in and recommended "giving pt some time."  Mother, mat grandmother  And father left. Seems to have calmed pt. Pt on 5 mUs of Pitocin.  Contractions mild.  Offered to let pt get epidural prior to AROM. Desired to proceed with AROM.  VSS Cervix 6/80/-1 Vertex, well engaged. IUPC placed posteriorly. AROM clear Cat 1 tracing  IUP @ 37 0/7 weeks, h/o c-section, VBAC x 1. IOL due to UVV. Anticipate delivery soon.

## 2015-08-14 NOTE — Progress Notes (Addendum)
Pt's Arrival time: ~ 12:20 AM.   In to make the acquaintance of [name], 24 yo [id number] @ 37.0 wks who presents for IOL per MFM due to h/o fetal umbilical vein varix. Accompanied by family members.   S: Reports +FM. Denies VB or LOF. Rare ctxs. Insists on starting Pitocin now versus waiting - states "I am ready to have this baby." States ok with delivery by this provider w/ Dr. Estanislado Pandyivard as back-up.  O: Gen: Anxious  A: 24 yo G3P1102 @ 37.0 wks here for IOL due to h/o fetal umbilical vein varix VBAC candidate (one previous successful VBAC)  P: Informed of plan of care per Dr. Dion BodyVarnado - low dose Pitocin w/ AROM by her  Continuous monitoring Consult prn Expect progress and successful VBAC   Sherre ScarletKimberly Bing Murphy, CNM 08/14/15, 12:40 AM

## 2015-08-14 NOTE — Lactation Note (Signed)
This note was copied from the chart of [name] [place]. Lactation Consultation Note  Patient Name: [name] Raynes ZOXWR'UToday's Date: 08/14/2015 Reason for consult: Initial assessment Baby at 5 hr of life and mom reports that baby is not sucking. She stated that he is holding the nipple in his mouth and licking the nipple. Discussed baby behavior, feeding frequency, baby belly size, voids, wt loss, breast changes, and nipple care. Encouraged mom to give bf some time. She reports that her milk is in, given spoon, walked her though manually expressing and spoon feeding. Given DEBP and breast pads. She will pump after latching. She had a Harmony in the room. Given lactation handouts and LPT infant handouts. Aware of OP services and support.    Maternal Data Formula Feeding for Exclusion: No Has patient been taught Hand Expression?: Yes Does the patient have breastfeeding experience prior to this delivery?: Yes  Feeding Feeding Type: Breast Fed Length of feed: 5 min (off and on)  LATCH Score/Interventions Latch: Repeated attempts needed to sustain latch, nipple held in mouth throughout feeding, stimulation needed to elicit sucking reflex. Intervention(s): Skin to skin;Teach feeding cues  Audible Swallowing: A few with stimulation (mom has lots of easily epressed colostrum)  Type of Nipple: Everted at rest and after stimulation  Comfort (Breast/Nipple): Soft / non-tender     Hold (Positioning): Assistance needed to correctly position infant at breast and maintain latch.  LATCH Score: 7  Lactation Tools Discussed/Used     Consult Status Consult Status: Follow-up Date: 08/15/15 Follow-up type: In-patient    Rulon Eisenmengerlizabeth E Metta Koranda 08/14/2015, 1:23 PM

## 2015-08-14 NOTE — Transfer of Care (Signed)
Immediate Anesthesia Transfer of Care Note  Patient: [name]  Procedure(s) Performed: Procedure(s): POST PARTUM TUBAL LIGATION (Bilateral)  Patient Location: PACU  Anesthesia Type:Spinal  Level of Consciousness: awake, alert  and oriented  Airway & Oxygen Therapy: Patient Spontanous Breathing  Post-op Assessment: Report given to RN and Post -op Vital signs reviewed and stable  Post vital signs: Reviewed and stable  Last Vitals:  Filed Vitals:   08/14/15 1140 08/14/15 1540  BP: 101/60 90/47  Pulse: 65 68  Temp: 36.9 C 37 C  Resp: 16 18    Complications: No apparent anesthesia complications

## 2015-08-14 NOTE — Progress Notes (Addendum)
S: Continues to request AROM even after verbalizing comprehension of plan for the night. Desires IV pain med.  O:  Today's Vitals   08/14/15 0416 08/14/15 0501 08/14/15 0619 08/14/15 0620  BP: 93/52   108/65  Pulse: 69   74  Temp:   98 F (36.7 C)   TempSrc:   Oral   Resp: 16   16  Height:      Weight:      PainSc:  8  10-Worst pain ever    Cat 1 FHRT Ctxs: q 3-4 min Cvx 5-6 cm per RN Pitocin at 5 mU/min  A: IOL Increased pain/anxiety  P: Stadol prn Will sign off to Dr. Dareen PianoVarnado  [name], CNM 08/14/15, 6:34 AM

## 2015-08-14 NOTE — Anesthesia Procedure Notes (Addendum)
Spinal Patient location during procedure: OR Start time: 08/14/2015 4:43 PM Staffing Anesthesiologist: Mal AmabileFOSTER, Kamika Goodloe Performed by: anesthesiologist  Preanesthetic Checklist Completed: patient identified, site marked, surgical consent, pre-op evaluation, timeout performed, IV checked, risks and benefits discussed and monitors and equipment checked Spinal Block Patient position: sitting Prep: site prepped and draped and DuraPrep Patient monitoring: heart rate, cardiac monitor, continuous pulse ox and blood pressure Approach: midline Location: L3-4 Injection technique: single-shot Needle Needle type: Sprotte  Needle gauge: 24 G Needle length: 9 cm Needle insertion depth: 5 cm Assessment Sensory level: T6 Additional Notes Patient tolerated procedure well. Adequate sensory level.

## 2015-08-14 NOTE — Progress Notes (Signed)
PT to OR via stretcher. Report given to RN in OR.

## 2015-08-14 NOTE — Anesthesia Preprocedure Evaluation (Addendum)
Anesthesia Evaluation  Patient identified by MRN, date of birth, ID band Patient awake    Reviewed: Allergy & Precautions, NPO status , Patient's Chart, lab work & pertinent test results  Airway Mallampati: II  TM Distance: >3 FB Neck ROM: Full    Dental no notable dental hx. (+) Teeth Intact   Pulmonary Current Smoker,    Pulmonary exam normal breath sounds clear to auscultation       Cardiovascular negative cardio ROS Normal cardiovascular exam Rhythm:Regular Rate:Normal     Neuro/Psych  Headaches, PSYCHIATRIC DISORDERS Anxiety Depression    GI/Hepatic GERD  ,  Endo/Other    Renal/GU   negative genitourinary   Musculoskeletal negative musculoskeletal ROS (+)   Abdominal   Peds negative pediatric ROS (+)  Hematology   Anesthesia Other Findings   Reproductive/Obstetrics negative OB ROS                           Anesthesia Physical Anesthesia Plan  ASA: II  Anesthesia Plan: Spinal   Post-op Pain Management:    Induction:   Airway Management Planned: Natural Airway  Additional Equipment:   Intra-op Plan:   Post-operative Plan:   Informed Consent: I have reviewed the patients History and Physical, chart, labs and discussed the procedure including the risks, benefits and alternatives for the proposed anesthesia with the patient or authorized representative who has indicated his/her understanding and acceptance.     Plan Discussed with: Anesthesiologist, CRNA and Surgeon  Anesthesia Plan Comments:        Anesthesia Quick Evaluation

## 2015-08-14 NOTE — Brief Op Note (Signed)
08/14/2015  5:51 PM  PATIENT:  [name]  24 y.o. female  PRE-OPERATIVE DIAGNOSIS:  Desires Sterilization  POST-OPERATIVE DIAGNOSIS:  desires sterilization  PROCEDURE:  Procedure(s): POST PARTUM TUBAL LIGATION (Bilateral) Bilateral Fibriaectomy  SURGEON:  Surgeon(s) and Role:    * Geryl RankinsEvelyn Lucita Montoya, MD - Primary  PHYSICIAN ASSISTANT:   ASSISTANTS: Technician   ANESTHESIA:   spinal  EBL:  Total I/O In: 1200 [I.V.:1200] Out: 497 [Urine:150; Blood:347]  BLOOD ADMINISTERED:none  DRAINS: Urinary Catheter (Foley)   LOCAL MEDICATIONS USED:  MARCAINE    and Amount: 14 ml  SPECIMEN:  Source of Specimen:  Bilateral fallopian tube segments  DISPOSITION OF SPECIMEN:  PATHOLOGY  COUNTS:  YES  TOURNIQUET:  * No tourniquets in log *  DICTATION: .Other Dictation: Dictation Number [id number]  PLAN OF CARE: Transfer to postpartum  PATIENT DISPOSITION:  PACU - hemodynamically stable.   Delay start of Pharmacological VTE agent (>24hrs) due to surgical blood loss or risk of bleeding: yes

## 2015-08-15 ENCOUNTER — Encounter (HOSPITAL_COMMUNITY): Payer: Self-pay

## 2015-08-15 LAB — CBC
HEMATOCRIT: 29.9 % — AB (ref 36.0–46.0)
HEMOGLOBIN: 10.2 g/dL — AB (ref 12.0–15.0)
MCH: 32.2 pg (ref 26.0–34.0)
MCHC: 34.1 g/dL (ref 30.0–36.0)
MCV: 94.3 fL (ref 78.0–100.0)
Platelets: 243 10*3/uL (ref 150–400)
RBC: 3.17 MIL/uL — ABNORMAL LOW (ref 3.87–5.11)
RDW: 13.4 % (ref 11.5–15.5)
WBC: 16.9 10*3/uL — ABNORMAL HIGH (ref 4.0–10.5)

## 2015-08-15 NOTE — Progress Notes (Signed)
CLINICAL SOCIAL WORK MATERNAL/CHILD NOTE  Patient Details  Name: [name] MRN: [id number] Date of Birth: 08/14/2017  Date: 08/15/2015  Clinical Social Worker Initiating Note: Cumi Bevel, LCSWDate/ Time Initiated: 08/15/15/1000   Child's Name: [name]   Legal Guardian: Mother   Need for Interpreter: None   Date of Referral: 08/15/15   Reason for Referral: Other (Comment)   Referral Source: [facility]   Address: [phone] [place], [place] [address]  Phone number:  [phone])   Household Members: Self, Minor Children   Natural Supports (not living in the home): Immediate Family, Extended Family   Professional Supports:None   Employment:Unemployed   Type of Work:     Education: Nurse, adult (Chief Technology Officer)   Financial Resources:Medicaid   Other Resources: [employer], Physicist, medical  (housing assistance)   Cultural/Religious Considerations Which May Impact Care: none noted  Strengths: Ability to meet basic needs , Home prepared for child , Pediatrician chosen    Risk Factors/Current Problems:  (hx of marijuana use)   Cognitive State: Alert , Able to Concentrate    Mood/Affect: Calm , Happy    CSW Assessment: Acknowledged order for social work consult to assess mother's hx of marijuana use. Met with mother who was pleasant and receptive to CSW. She has two other dependentsa ages 34 and 37. FOB is reportedly uninvolved and she did not identify him. MOB admits to hx of marijuana and states that she last used about 3 weeks ago. She notes that it reduces her anxiety and helps her to sleep. She denies any other illicit drug use. Mother states that she has hx of anxiety and was prescribe .37m of Xanax, but didn't take any during pregnancy. She also reports being prescribed Adderall and notes that she also stopped this medication because of the pregnancy. She  denies any currently symptoms of anxiety or depression. She reports having extensive family support. UDS on newborn is negative. Informed that she is well prepared at home for newborn. She denies any hx of DSS involvement. Informed her of CSW availability  CSW Plan/Description:    No barriers to discharge Will continue to monitor drug screen   Bevel, Cumi J, LCSW 08/15/2015, 4:49 PM

## 2015-08-15 NOTE — Progress Notes (Signed)
Patient continues to complain of cramping and bloating after pain medication & simethicone. Pt given Kpad, educated on use. Will reassess pt status after use.

## 2015-08-15 NOTE — Lactation Note (Addendum)
This note was copied from the chart of Hannah Bishop LimboHaley Deguire. Lactation Consultation Note  P3, Mother states she had difficulty latching other children so she pumped w/ both children and gave them breastmilk in bottle.  One of her children was born at 6634 weeks. Mother explained she had an abundant supply of breastmilk and has to pump every 1.5 hours. Discussed possible ways to reduce her supply if needed in the future. She states this baby is having difficulty latching.  Left LC phone number and suggest she call to get assistance w/ latching.   Patient Name: [name] ZOXWR'UToday's Date: 08/15/2015     Maternal Data    Feeding    LATCH Score/Interventions                      Lactation Tools Discussed/Used     Consult Status      Dahlia ByesBerkelhammer, Shaunee Mulkern [facility] Center[facility]chen 08/15/2015, 3:28 PM

## 2015-08-15 NOTE — Progress Notes (Signed)
Postpartum day #1, NSVD POD #1, PPBTL  Subjective Pt without complaints.  Lochia normal.  Pain controlled.  Breast feeding: pumping, plans to bottle feed.  Baby doing well.  Temp:  [97.2 F (36.2 C)-98.6 F (37 C)] 97.2 F (36.2 C) (12/03 0534) Pulse Rate:  [49-68] 52 (12/03 0534) Resp:  [16-22] 16 (12/03 0534) BP: (90-110)/(47-67) 110/67 mmHg (12/03 0534) SpO2:  [96 %-99 %] 99 % (12/03 0534)  Gen:  NAD, A&O x 3 Uterine fundus:  Firm, nontender Abdomen:  Distended, tympanic.  Dressing clean and dry. Lochia normal Ext:  No Edema, no calf tenderness bilaterally  CBC    Component Value Date/Time   WBC 16.9* 08/15/2015 0550   RBC 3.17* 08/15/2015 0550   HGB 10.2* 08/15/2015 0550   HCT 29.9* 08/15/2015 0550   PLT 243 08/15/2015 0550   MCV 94.3 08/15/2015 0550   MCH 32.2 08/15/2015 0550   MCHC 34.1 08/15/2015 0550   RDW 13.4 08/15/2015 0550   LYMPHSABS 0.8 01/23/2013 1216   MONOABS 0.4 01/23/2013 1216   EOSABS 0.0 01/23/2013 1216   BASOSABS 0.0 01/23/2013 1216     A/P: S/p SVD and PPBTL- doing well. Routine postpartum care. Lactation support. Discharge in am. Outpatient circumcision.  [name] 08/15/2015, 11:20 AM

## 2015-08-16 MED ORDER — BISACODYL 10 MG RE SUPP
10.0000 mg | Freq: Every day | RECTAL | Status: DC | PRN
  Filled 2015-08-16: qty 1

## 2015-08-16 MED ORDER — IBUPROFEN 600 MG PO TABS: 600.0000 mg | ORAL_TABLET | Freq: Four times a day (QID) | ORAL | Status: DC

## 2015-08-16 MED ORDER — OXYCODONE-ACETAMINOPHEN 5-325 MG PO TABS: 2.0000 | ORAL_TABLET | ORAL | Status: DC | PRN

## 2015-08-16 NOTE — Progress Notes (Signed)
Discharge papers and instruction given to patient. Denies questions at this time.

## 2015-08-16 NOTE — Discharge Instructions (Signed)

## 2015-08-16 NOTE — Anesthesia Postprocedure Evaluation (Signed)
Anesthesia Post Note  Patient: Kennis CarinaHaley N Weare  Procedure(s) Performed: Procedure(s) (LRB): POST PARTUM TUBAL LIGATION (Bilateral)  Patient location during evaluation: Mother Baby Anesthesia Type: Epidural Level of consciousness: awake and alert and oriented Pain management: satisfactory to patient Vital Signs Assessment: post-procedure vital signs reviewed and stable Respiratory status: respiratory function stable and spontaneous breathing Cardiovascular status: stable Postop Assessment: No headache, No backache, Patient able to bend at knees, No signs of nausea or vomiting, no headache, no backache, patient able to bend at knees and Adequate PO intake (No residual numbness) Anesthetic complications: no    Last Vitals:  Filed Vitals:   08/15/15 1850 08/16/15 0550  BP: 112/77 101/67  Pulse: 65 65  Temp: 37.2 C 36.8 C  Resp: 18 16    Last Pain:  Filed Vitals:   08/16/15 0732  PainSc: Asleep                 Grayce Budden

## 2015-08-16 NOTE — Addendum Note (Signed)
Addendum  created 08/16/15 1004 by Graciela HusbandsWynn O Terence Bart, CRNA   Modules edited: Notes Section   Notes Section:  File: [id number]

## 2015-08-16 NOTE — Lactation Note (Addendum)
This note was copied from the chart of Hannah Bishop LimboHaley Helms. Lactation Consultation Note  Baby < 6lbs 37 weeks. Mother wanted help w/ latching. Attempted latching but baby did not sustain latch.  Aplied #24NS.  Prefilled w/ 3 ml of colostrum.  Baby latched for approx 10 min.  A few sucks and swallows observed. Reviewed LPI feeding behavior.  Suggest she supplement w/ the remainder of pumped breastmilk in bottle 12ml and give the difference w/ Neosure. Discussed volume guidelines according to age and paced feeding. Mother's breasts are filling.  Suggest she call for ice if she becomes engorged.  Patient Name: [name] ZOXWR'UToday's Date: 08/16/2015 Reason for consult: Follow-up assessment   Maternal Data    Feeding Feeding Type: Breast Fed Length of feed: 10 min  LATCH Score/Interventions Latch: Repeated attempts needed to sustain latch, nipple held in mouth throughout feeding, stimulation needed to elicit sucking reflex. Intervention(s): Waking techniques  Audible Swallowing: A few with stimulation  Type of Nipple: Everted at rest and after stimulation  Comfort (Breast/Nipple): Soft / non-tender     Hold (Positioning): Assistance needed to correctly position infant at breast and maintain latch.  LATCH Score: 7  Lactation Tools Discussed/Used Tools: Nipple Shields Nipple shield size: 24   Consult Status Consult Status: Follow-up Date: 08/17/15 Follow-up type: In-patient    Dahlia ByesBerkelhammer, Satya Buttram [facility] 08/16/2015, 9:55 AM

## 2015-08-17 ENCOUNTER — Encounter (HOSPITAL_COMMUNITY): Payer: Self-pay | Admitting: Obstetrics and Gynecology

## 2015-08-17 NOTE — Op Note (Signed)
NAMEITZABELLA, SORRELS                  ACCOUNT NO.:  [id number]  MEDICAL RECORD NO.:  [id number]  LOCATION:  9132                          FACILITY:  [facility]  PHYSICIAN:  Pieter Partridge, MD   DATE OF BIRTH:  [birth year]  DATE OF PROCEDURE:  08/14/2015 DATE OF DISCHARGE:  08/16/2015                              OPERATIVE REPORT   PREOPERATIVE DIAGNOSIS:  Desires sterilization.  POSTOPERATIVE DIAGNOSIS:  Desires sterilization.  PROCEDURE:  Postpartum bilateral tubal ligation (bilateral fimbriectomy).  SURGEON:  Pieter Partridge, MD.  ASSISTANT:  Technician.  ANESTHESIA:  Spinal.  ESTIMATED BLOOD LOSS:  Minimal.  DRAINS:  Foley catheter.  LOCAL SEDATION:  A 0.25% Marcaine, amount used is 14 mL.  SPECIMENS:  Bilateral fallopian tube segments.  DISPOSITION:  Specimens to Pathology.  COUNTS:  Correct.  PATIENT DISPOSITION:  To PACU hemodynamically stable.  COMPLICATIONS:  Bilateral fallopian tubes with some scarring.  The left had had a cyst, the right had some torsing due to the adhesions and edema. Portion of ovaries appeared normal.  PROCEDURE IN DETAIL:  The patient was taken to the operating room with IV running.  She underwent spinal anesthesia without complication.  She was then placed in the dorsal supine position, prepped and draped in normal sterile fashion.  A time-out was performed.  Adequate anesthesia was confirmed.  Umbilical incision was marked.  Incision was made with a scalpel, and the subcutaneous space was separated with a Tresa Endo.  The fascia was identified and grasped with the hemostat, meaning with a Kocher x2 and entered sharply with a scalpel.  Intraabdominal access was confirmed. Fascia was packed.  [employer] retractors were used to identify the fallopian tubes.  The 4 x 18 was placed to displace the bowel.  The patient was also placed in Trendelenburg.  The fallopian tube was grasped with the Babcock and carried out to the fimbriated end.  Again,  there was a cyst there.  So, with a Kelly clamp, it was grasped and hemostat and that was suture ligated with 2-0 plain gut x2.  That was done more on a Pomeroy fashion. The Metzenbaum were used to remove the stump but there was a small portion of the fimbria remaining.  Attention was then turned to the right side.  The moistened 4 x 18 was repositioned.  Tubes were found easily, carried out to the fimbriated end.  This too appeared edematous with some scarring that caused it to have a torturous pattern.  I decided to take the distal portion of the fallopian tube.  It was grasped with a Kelly clamp and transected.  The pedicle slipped out.  So, another clamp was used.  Freehand tie was used, and then a Heaney stitch was placed.  Tubes were returned to the abdomen.  The movement of both tubes were cauterized with the Bovie.  Once they were placed in the abdomen, I did look to make sure there was no active bleeding.  Sponges were removed from the abdomen.  The fascia was then closed with 0 Vicryl in a continuous running fashion, and then the skin was reapproximated with 4- 0 Monocryl in a subcuticular fashion.  The  incision was well approximated, and Dermabond was applied.  A honeycomb dressing was applied.  The patient received Ancef 2 g IV prior to the procedure.  She had SCDs on and operating, and she received Ancef 2 g IV.  All instruments, sponge, and needle counts were correct x3.     Pieter PartridgeEvelyn B Isamar Nazir, MD     EBV/MEDQ  D:  08/14/2015  T:  08/15/2015  Job:  [id number]

## 2015-08-18 ENCOUNTER — Ambulatory Visit (HOSPITAL_COMMUNITY): Payer: Medicaid Other

## 2015-08-18 ENCOUNTER — Other Ambulatory Visit (HOSPITAL_COMMUNITY): Payer: Medicaid Other

## 2015-08-21 ENCOUNTER — Other Ambulatory Visit (HOSPITAL_COMMUNITY): Payer: Medicaid Other

## 2015-08-21 ENCOUNTER — Ambulatory Visit (HOSPITAL_COMMUNITY): Payer: Medicaid Other

## 2015-09-17 NOTE — Discharge Summary (Signed)
OB Discharge Summary     Patient Name: [name] DOB: 1990-12-12 MRN: [id number]  Date of admission: 08/14/2015 Delivering MD: Scheryl MartenFORSELL, KATHERINE L  (Delivery unattended by MD due to precipitious delivery)  Date of discharge: 08/16/2015  Admitting diagnosis: INDUCTION Desires Sterilization Intrauterine pregnancy: 3136w0d     Secondary diagnosis:  Active Problems:   Fetal abnormality affecting management of mother, antepartum condition or complication Umbilical Vein Varix Additional problems: H/o anxiety, smoker, H/o PTD s/p 17 P     Discharge diagnosis: Term Pregnancy Delivered                                                                                                Post partum procedures:postpartum tubal ligation  Augmentation: AROM and Pitocin  Complications: None  Hospital course:  Induction of Labor With Vaginal Delivery   25 y.o. yo [phone] at 8136w0d was admitted to the hospital 08/14/2015 for induction of labor.  Indication for induction: Umbilical vein varix.  Patient had an uncomplicated labor course as follows: Membrane Rupture Time/Date: 7:40 AM ,08/14/2015   Intrapartum Procedures: Episiotomy: None [1]                                         Lacerations:  Labial [10]  Patient had delivery of a Viable infant.  Information for the patient's newborn:  Vivia BudgeMonk, Jonah Scott [[id number]][[id number]]  Delivery Method: VBAC, Spontaneous (Filed from Delivery Summary)   08/14/2015  Details of delivery can be found in separate delivery note.  Patient had a routine postpartum course. Patient is discharged home 09/17/2015.   Physical exam  Filed Vitals:   08/15/15 0534 08/15/15 1127 08/15/15 1850 08/16/15 0550  BP: 110/67  112/77 101/67  Pulse: 52 65 65 65  Temp: 97.2 F (36.2 C)  98.9 F (37.2 C) 98.2 F (36.8 C)  TempSrc: Oral  Oral Oral  Resp: 16  18 16   Height:      Weight:      SpO2: 99% 99% 94% 95%   General: alert, cooperative and no distress Lochia:  appropriate Uterine Fundus: firm Incision: Clean, dry, intact, nontender DVT Evaluation: No evidence of DVT seen on physical exam. Labs: Lab Results  Component Value Date   WBC 16.9* 08/15/2015   HGB 10.2* 08/15/2015   HCT 29.9* 08/15/2015   MCV 94.3 08/15/2015   PLT 243 08/15/2015   CMP Latest Ref Rng 01/23/2013  Glucose 70 - 99 mg/dL 324(M131(H)  BUN 6 - 23 mg/dL 16  Creatinine 0.100.50 - 2.721.10 mg/dL 5.360.75  Sodium 644135 - 034145 mEq/L 138  Potassium 3.5 - 5.1 mEq/L 4.1  Chloride 96 - 112 mEq/L 100  CO2 19 - 32 mEq/L 28  Calcium 8.4 - 10.5 mg/dL 9.9  Total Protein 6.0 - 8.3 g/dL 7.4(Q8.4(H)  Total Bilirubin 0.3 - 1.2 mg/dL 0.3  Alkaline Phos 39 - 117 U/L 92  AST 0 - 37 U/L 17  ALT 0 - 35 U/L 14    Discharge instruction: per After  Visit Summary and "Baby and Me Booklet".  After visit meds:    Medication List    TAKE these medications        calcium carbonate 500 MG chewable tablet  Commonly known as:  TUMS - dosed in mg elemental calcium  Chew 1-2 tablets by mouth daily as needed for indigestion or heartburn.     ibuprofen 600 MG tablet  Commonly known as:  ADVIL,MOTRIN  Take 1 tablet (600 mg total) by mouth every 6 (six) hours.     NIFEdipine 10 MG capsule  Commonly known as:  PROCARDIA  Take 10 mg by mouth 2 (two) times daily.     oxyCODONE-acetaminophen 5-325 MG tablet  Commonly known as:  PERCOCET/ROXICET  Take 2 tablets by mouth every 4 (four) hours as needed (for pain scale greater than 7).     prenatal multivitamin Tabs tablet  Take 1 tablet by mouth daily.        Diet: routine diet  Activity: Advance as tolerated. Pelvic rest for 6 weeks.   Outpatient follow up:2 weeks Follow up Appt:Future Appointments Date Time Provider Department Center  10/07/2015 1:30 PM Nelwyn Salisbury, MD [facility]   Follow up Visit:No Follow-up on file.  Postpartum contraception: Tubal Ligation  Newborn Data: Live born female  Birth Weight: 6 lb 4.9 oz (2860 g) APGAR: 8,  9  Baby Feeding: Breast Disposition:home with mother Outpatient circumsicion.  09/17/2015 Hannah Rankins, MD

## 2015-10-07 ENCOUNTER — Encounter: Payer: Self-pay | Admitting: Family Medicine

## 2015-10-07 ENCOUNTER — Ambulatory Visit (INDEPENDENT_AMBULATORY_CARE_PROVIDER_SITE_OTHER): Payer: Medicaid Other | Admitting: Family Medicine

## 2015-10-07 VITALS — BP 112/67 | HR 66 | Temp 99.1°F | Ht 66.0 in | Wt 144.0 lb

## 2015-10-07 DIAGNOSIS — F419 Anxiety disorder, unspecified: Secondary | ICD-10-CM

## 2015-10-07 DIAGNOSIS — F900 Attention-deficit hyperactivity disorder, predominantly inattentive type: Secondary | ICD-10-CM

## 2015-10-07 MED ORDER — AMPHETAMINE-DEXTROAMPHET ER 20 MG PO CP24: 20.0000 mg | ORAL_CAPSULE | Freq: Two times a day (BID) | ORAL | Status: DC

## 2015-10-07 MED ORDER — ALPRAZOLAM 0.5 MG PO TABS
0.5000 mg | ORAL_TABLET | Freq: Three times a day (TID) | ORAL | Status: DC | PRN
Start: 2015-10-07 — End: 2016-03-26

## 2015-10-07 NOTE — Progress Notes (Signed)
Pre visit review using our clinic review tool, if applicable. No additional management support is needed unless otherwise documented below in the visit note. 

## 2015-10-07 NOTE — Progress Notes (Signed)
   Subjective:    Patient ID: [name], female    DOB: 12/12/1990, 25 y.o.   MRN: [id number]  HPI Here asking to resume some medications she had been taking prior to her last pregnancy. She had been on Xanax for anxiety and Adderall XR for ADHD. Of course she stopped these when she knew she was pregnant. Now she is 2 months post partem and she has stopped breast feeding. She is struggling with her focus, with keeping up with responsibilities, and with managing a household with 3 young children. Her anxiety has flared up again also.   Review of Systems  Constitutional: Negative.   Respiratory: Negative.   Cardiovascular: Negative.   Neurological: Negative.   Psychiatric/Behavioral: Positive for decreased concentration. Negative for dysphoric mood and agitation. The patient is nervous/anxious.        Objective:   Physical Exam  Constitutional: She is oriented to person, place, and time. She appears well-developed and well-nourished.  Neck: No thyromegaly present.  Cardiovascular: Normal rate, regular rhythm, normal heart sounds and intact distal pulses.   Pulmonary/Chest: Effort normal and breath sounds normal.  Lymphadenopathy:    She has no cervical adenopathy.  Neurological: She is alert and oriented to person, place, and time.  Psychiatric: She has a normal mood and affect. Her behavior is normal. Thought content normal.          Assessment & Plan:  We will refill Xanax for anxiety, and she will restart the Adderall XR 20 mg bid. Recheck prn

## 2015-12-28 ENCOUNTER — Telehealth: Payer: Self-pay | Admitting: Family Medicine

## 2015-12-28 NOTE — Telephone Encounter (Signed)
Pt request refill  °amphetamine-dextroamphetamine (ADDERALL XR) 20 MG 24 hr capsule ° ° °

## 2015-12-29 MED ORDER — AMPHETAMINE-DEXTROAMPHET ER 20 MG PO CP24: 20.0000 mg | ORAL_CAPSULE | Freq: Two times a day (BID) | ORAL | Status: DC

## 2015-12-29 NOTE — Telephone Encounter (Signed)
Rx is ready for pick up. Pt is aware  

## 2015-12-29 NOTE — Telephone Encounter (Signed)
done

## 2016-01-04 ENCOUNTER — Ambulatory Visit (INDEPENDENT_AMBULATORY_CARE_PROVIDER_SITE_OTHER): Payer: Medicaid Other | Admitting: Family Medicine

## 2016-01-04 ENCOUNTER — Encounter: Payer: Self-pay | Admitting: Family Medicine

## 2016-01-04 VITALS — BP 98/67 | HR 74 | Temp 98.3°F | Ht 66.0 in | Wt 136.0 lb

## 2016-01-04 DIAGNOSIS — Z800 Family history of malignant neoplasm of digestive organs: Secondary | ICD-10-CM | POA: Diagnosis not present

## 2016-01-04 DIAGNOSIS — R5383 Other fatigue: Secondary | ICD-10-CM

## 2016-01-04 LAB — CBC WITH DIFFERENTIAL/PLATELET
Basophils Absolute: 0.1 10*3/uL (ref 0.0–0.1)
Basophils Relative: 0.6 % (ref 0.0–3.0)
EOS ABS: 0.2 10*3/uL (ref 0.0–0.7)
Eosinophils Relative: 2.4 % (ref 0.0–5.0)
HEMATOCRIT: 44.4 % (ref 36.0–46.0)
HEMOGLOBIN: 15 g/dL (ref 12.0–15.0)
LYMPHS PCT: 28.2 % (ref 12.0–46.0)
Lymphs Abs: 2.3 10*3/uL (ref 0.7–4.0)
MCHC: 33.7 g/dL (ref 30.0–36.0)
MCV: 90.2 fl (ref 78.0–100.0)
Monocytes Absolute: 0.6 10*3/uL (ref 0.1–1.0)
Monocytes Relative: 6.9 % (ref 3.0–12.0)
NEUTROS ABS: 5.1 10*3/uL (ref 1.4–7.7)
Neutrophils Relative %: 61.9 % (ref 43.0–77.0)
Platelets: 285 10*3/uL (ref 150.0–400.0)
RBC: 4.93 Mil/uL (ref 3.87–5.11)
RDW: 12.7 % (ref 11.5–15.5)
WBC: 8.3 10*3/uL (ref 4.0–10.5)

## 2016-01-04 LAB — HEPATIC FUNCTION PANEL
ALBUMIN: 4.6 g/dL (ref 3.5–5.2)
ALK PHOS: 66 U/L (ref 39–117)
ALT: 10 U/L (ref 0–35)
AST: 10 U/L (ref 0–37)
Bilirubin, Direct: 0.1 mg/dL (ref 0.0–0.3)
TOTAL PROTEIN: 7.3 g/dL (ref 6.0–8.3)
Total Bilirubin: 0.6 mg/dL (ref 0.2–1.2)

## 2016-01-04 LAB — BASIC METABOLIC PANEL
BUN: 10 mg/dL (ref 6–23)
CALCIUM: 9.8 mg/dL (ref 8.4–10.5)
CO2: 27 meq/L (ref 19–32)
CREATININE: 0.73 mg/dL (ref 0.40–1.20)
Chloride: 105 mEq/L (ref 96–112)
GFR: 102.04 mL/min (ref >60.00)
GLUCOSE: 81 mg/dL (ref 70–99)
Potassium: 4.2 mEq/L (ref 3.5–5.1)
SODIUM: 142 meq/L (ref 135–145)

## 2016-01-04 LAB — VITAMIN B12: Vitamin B-12: 446 pg/mL (ref 211–911)

## 2016-01-04 LAB — TSH: TSH: 0.47 u[IU]/mL (ref 0.35–4.50)

## 2016-01-04 NOTE — Progress Notes (Signed)
   Subjective:    Patient ID: [name], female    DOB: May 08, 1991, 25 y.o.   MRN: [id number]  HPI Here with several questions. First she has felt very tired for some reason for the past few months. She is busy looking after 3 children but she thinks she get enough sleep. She eats 2 good meals a day. She mentions some mild swelling of the hands and feet at times. Her menses have returned to a normal schedule. She is using nicotine patches to help quit smoking. She is concerned about when to begin screening for colon cancer, since her mother was diagnosed with this at the age of 27 years.    Review of Systems  Constitutional: Positive for fatigue. Negative for activity change, appetite change and unexpected weight change.  Respiratory: Negative.   Cardiovascular: Negative.   Endocrine: Negative.   Genitourinary: Negative.   Neurological: Negative.        Objective:   Physical Exam  Constitutional: She is oriented to person, place, and time. She appears well-developed and well-nourished.  Neck: No thyromegaly present.  Cardiovascular: Normal rate, regular rhythm, normal heart sounds and intact distal pulses.   Pulmonary/Chest: Effort normal and breath sounds normal.  Musculoskeletal: She exhibits no edema.  Lymphadenopathy:    She has no cervical adenopathy.  Neurological: She is alert and oriented to person, place, and time.          Assessment & Plan:  Generalized fatigue, we wil screen with labs today. I encouraged her to exercise when she can fine the time. We will have her meet with GI to discuss when to begin colon screening.  Nelwyn SalisburyFRY,Calyb Mcquarrie A, MD

## 2016-01-04 NOTE — Progress Notes (Signed)
Pre visit review using our clinic review tool, if applicable. No additional management support is needed unless otherwise documented below in the visit note. 

## 2016-01-05 ENCOUNTER — Encounter: Payer: Self-pay | Admitting: Internal Medicine

## 2016-03-01 ENCOUNTER — Ambulatory Visit: Payer: Medicaid Other | Admitting: Internal Medicine

## 2016-03-26 ENCOUNTER — Other Ambulatory Visit: Payer: Self-pay | Admitting: Family Medicine

## 2016-03-28 NOTE — Telephone Encounter (Signed)
Call in #[phone] with 5 rf 

## 2016-04-04 ENCOUNTER — Telehealth: Payer: Self-pay | Admitting: Family Medicine

## 2016-04-04 MED ORDER — AMPHETAMINE-DEXTROAMPHET ER 20 MG PO CP24: 20.0000 mg | ORAL_CAPSULE | Freq: Two times a day (BID) | ORAL | 0 refills | Status: DC

## 2016-04-04 NOTE — Telephone Encounter (Signed)
Pt needs new rx generic adderall xr 20 mg #60

## 2016-04-04 NOTE — Telephone Encounter (Signed)
done

## 2016-04-04 NOTE — Telephone Encounter (Signed)
Script is ready for pick up here at front office and l left a voice message with this information.

## 2016-05-14 ENCOUNTER — Other Ambulatory Visit: Payer: Self-pay | Admitting: Family Medicine

## 2016-05-17 NOTE — Telephone Encounter (Signed)
NO, we gave her a 6 month supply in July

## 2016-05-24 ENCOUNTER — Other Ambulatory Visit: Payer: Self-pay | Admitting: Family Medicine

## 2016-08-11 ENCOUNTER — Ambulatory Visit (INDEPENDENT_AMBULATORY_CARE_PROVIDER_SITE_OTHER): Payer: Medicaid Other | Admitting: Family Medicine

## 2016-08-11 ENCOUNTER — Encounter: Payer: Self-pay | Admitting: Family Medicine

## 2016-08-11 VITALS — BP 110/76 | HR 68 | Temp 98.1°F | Wt 141.8 lb

## 2016-08-11 DIAGNOSIS — B354 Tinea corporis: Secondary | ICD-10-CM | POA: Diagnosis not present

## 2016-08-11 MED ORDER — KETOCONAZOLE 2 % EX CREA: 1.0000 "application " | TOPICAL_CREAM | Freq: Every day | CUTANEOUS | 0 refills | Status: DC

## 2016-08-11 NOTE — Patient Instructions (Signed)
Please use cream as directed and follow up if symptoms do not improve, worsen, or you develop new symptoms.   Body Ringworm Introduction Body ringworm is an infection of the skin that often causes a ring-shaped rash. Body ringworm can affect any part of your skin. It can spread easily to others. Body ringworm is also called tinea corporis. What are the causes? This condition is caused by funguses called dermatophytes. The condition develops when these funguses grow out of control on the skin. You can get this condition if you touch a person or animal that has it. You can also get it if you share clothing, bedding, towels, or any other object with an infected person or pet. What increases the risk? This condition is more likely to develop in:  Athletes who often make skin-to-skin contact with other athletes, such as wrestlers.  People who share equipment and mats.  People with a weakened immune system. What are the signs or symptoms? Symptoms of this condition include:  Itchy, raised red spots and bumps.  Red scaly patches.  A ring-shaped rash. The rash may have:  A clear center.  Scales or red bumps at its center.  Redness near its borders.  Dry and scaly skin on or around it. How is this diagnosed? This condition can usually be diagnosed with a skin exam. A skin scraping may be taken from the affected area and examined under a microscope to see if the fungus is present. How is this treated? This condition may be treated with:  An antifungal cream or ointment.  An antifungal shampoo.  Antifungal medicines. These may be prescribed if your ringworm is severe, keeps coming back, or lasts a long time. Follow these instructions at home:  Take over-the-counter and prescription medicines only as told by your health care provider.  If you were given an antifungal cream or ointment:  Use it as told by your health care provider.  Wash the infected area and dry it completely  before applying the cream or ointment.  If you were given an antifungal shampoo:  Use it as told by your health care provider.  Leave the shampoo on your body for 3-5 minutes before rinsing.  While you have a rash:  Wear loose clothing to stop clothes from rubbing and irritating it.  Wash or change your bed sheets every night.  If your pet has the same infection, take your pet to see a International aid/development workerveterinarian. How is this prevented?  Practice good hygiene.  Wear sandals or shoes in public places and showers.  Do not share personal items with others.  Avoid touching red patches of skin on other people.  Avoid touching pets that have bald spots.  If you touch an animal that has a bald spot, wash your hands. Contact a health care provider if:  Your rash continues to spread after 7 days of treatment.  Your rash is not gone in 4 weeks.  The area around your rash gets red, warm, tender, and swollen. This information is not intended to replace advice given to you by your health care provider. Make sure you discuss any questions you have with your health care provider. Document Released: 08/26/2000 Document Revised: 02/04/2016 Document Reviewed: 06/25/2015  2017 Elsevier

## 2016-08-11 NOTE — Progress Notes (Signed)
Pre visit review using our clinic review tool, if applicable. No additional management support is needed unless otherwise documented below in the visit note. 

## 2016-08-11 NOTE — Progress Notes (Signed)
Subjective:    Patient ID: [name], female    DOB: 09/29/1990, 25 y.o.   MRN: [id number]  HPI  [name] is a 25 year old female who presents today with raised red lesion on left lower back that started one week ago.  Associated symptoms of pruritis and burning. She denies fever, chills, sweats, drainage from lesion.  She denies pets in the household. She denies recent sick contact exposure. She has 3 children ages 117, 375, and 1 year. No aggravating or alleviating factors noted. Treatment at home includes hydrocortisone treatment with no benefit.  Review of Systems  Constitutional: Negative for chills, fatigue and fever.  HENT: Negative for congestion and postnasal drip.   Respiratory: Negative for cough.   Cardiovascular: Negative for chest pain and palpitations.  Gastrointestinal: Negative for abdominal pain, diarrhea, nausea and vomiting.  Musculoskeletal: Negative for myalgias.  Skin: Negative for rash.       Red, raised lesion on left lower back  Neurological: Negative for dizziness and headaches.   Past Medical History:  Diagnosis Date  . Anemia    with pregnancy  . Anxiety   . Chronic headaches    began late 2012  . Depression   . GERD (gastroesophageal reflux disease)   . History of kidney stones    x 4, first time age 25yo  . Previous cesarean delivery, antepartum condition or complication 04/01/2011  . Seasonal allergic rhinitis      Social History   Social History  . Marital status: Single    Spouse name: N/A  . Number of children: N/A  . Years of education: N/A   Occupational History  . insurance agent, Investment banker, [employer]   Social History Main Topics  . Smoking status: Current Every Day Smoker    Years: 7.00    Types: Cigarettes  . Smokeless tobacco: Never Used     Comment: 4-5 cigarettes per day  . Alcohol use No  . Drug use: No  . Sexual activity: No   Other Topics Concern  . Not on file   Social History Narrative   Single, 2  children, ages 392yo and 48mo, exercise - running after her children    Past Surgical History:  Procedure Laterality Date  . CESAREAN SECTION    . TONSILLECTOMY    . TUBAL LIGATION Bilateral 08/14/2015   Procedure: POST PARTUM TUBAL LIGATION;  Surgeon: Geryl RankinsEvelyn Varnado, MD;  Location: [facility];  Service: Gynecology;  Laterality: Bilateral;    Family History  Problem Relation Age of Onset  . Hypertension Mother   . Cancer Mother [id number]    colon  . Diabetes Mother     borderline  . Thyroid disease Mother   . Depression Mother   . Anxiety disorder Mother   . Hypertension Maternal Grandfather   . Heart disease Maternal Grandfather   . Other Father     unknown  . Cancer Father     brain cancer?  . ADD / ADHD Sister   . Heart disease Maternal Uncle   . Diabetes Maternal Grandmother   . Stroke Neg Hx   . Cancer Other     others on maternal side  . Heart murmur Daughter     No Known Allergies  Current Outpatient Prescriptions on File Prior to Visit  Medication Sig Dispense Refill  . ALPRAZolam (XANAX) 0.5 MG tablet TAKE ONE TABLET BY MOUTH THREE TIMES DAILY AS NEEDED FOR ANXIETY 90 tablet 5  .  amphetamine-dextroamphetamine (ADDERALL XR) 20 MG 24 hr capsule Take 1 capsule (20 mg total) by mouth 2 (two) times daily. 60 capsule 0   No current facility-administered medications on file prior to visit.     BP 110/76 (BP Location: Left Arm, Patient Position: Sitting, Cuff Size: Normal)   Pulse 68   Temp 98.1 F (36.7 C) (Oral)   Wt 141 lb 12.8 oz (64.3 kg)   SpO2 97%   BMI 22.89 kg/m       Objective:   Physical Exam  Constitutional: She appears well-developed and well-nourished.  Eyes: Pupils are equal, round, and reactive to light. No scleral icterus.  Neck: Neck supple.  Cardiovascular: Normal rate and regular rhythm.   Pulmonary/Chest: Effort normal and breath sounds normal. She has no wheezes. She has no rales.  Lymphadenopathy:    She has no cervical adenopathy.  Skin:  Skin is warm and dry.  Circular, raised, erythematous, scaly lesion approximately 2 cm x 2 cm with central clearing on left side of lower back          Assessment & Plan:  1. Ringworm of body Exam and history support treatment for tinea. Advised patient to keep area dry and use cream daily. If lesion does not improve, worsens, or she develops additional lesions, advised her to follow up for further evaluation . We reviewed prevention with hand hygiene and advised her to not share personal items with others. - ketoconazole (NIZORAL) 2 % cream; Apply 1 application topically daily.  Dispense: 15 g; Refill: 0  Roddie McJulia Sareen Randon, FNP-C

## 2016-09-06 ENCOUNTER — Telehealth: Payer: Self-pay | Admitting: Family Medicine

## 2016-09-06 NOTE — Telephone Encounter (Signed)
Pt would like to see if Dr. Clent RidgesFry would call in a oral Rx for the skin rash.   Pharm:  [facility] ([address])   Pt is aware Dr. Clent RidgesFry will not be back in the office until 09/09/16.

## 2016-09-09 ENCOUNTER — Other Ambulatory Visit: Payer: Self-pay | Admitting: Family Medicine

## 2016-09-09 DIAGNOSIS — B359 Dermatophytosis, unspecified: Secondary | ICD-10-CM

## 2016-09-09 MED ORDER — TERBINAFINE HCL 250 MG PO TABS: 250.0000 mg | ORAL_TABLET | Freq: Every day | ORAL | 0 refills | Status: DC

## 2016-09-09 NOTE — Progress Notes (Signed)
Spoke with patient regarding topical treatment for tinea which has not resolved. Will provide oral terbinafine daily for 2 weeks. Advised patient to follow up for further evaluation if no improvement with oral treatment or new areas appear. She voiced understanding and agreed with plan.

## 2016-09-09 NOTE — Telephone Encounter (Signed)
Spoke with patient regarding topical treatment for tinea which has not resolved. Will provide oral terbinafine daily for 2 weeks. Advised patient to follow up for further evaluation if no improvement with oral treatment or new areas appear. She voiced understanding and agreed with plan. 

## 2016-09-09 NOTE — Telephone Encounter (Signed)
I would direct this request to Delbert HarnessJulie Kordsmeier, who evaluated the rash

## 2016-10-07 ENCOUNTER — Telehealth: Payer: Self-pay | Admitting: Family Medicine

## 2016-10-07 NOTE — Telephone Encounter (Signed)
° ° °  Mom call to say pt was seen in Nov for ring worm and was given some cream. Mom said daughter is now covered in them and is asking if  something else can be called in     [facility]

## 2016-10-07 NOTE — Telephone Encounter (Signed)
Call in Fluconazole 100 mg bid, #60 with no rf. If this does not help, she will need an OV for me to examine these spots

## 2016-10-10 MED ORDER — FLUCONAZOLE 100 MG PO TABS: 100.0000 mg | ORAL_TABLET | Freq: Two times a day (BID) | ORAL | 0 refills | Status: DC

## 2016-10-10 NOTE — Telephone Encounter (Signed)
I sent script e-scribe to below pharmacy and spoke iwht pt.

## 2016-12-19 ENCOUNTER — Ambulatory Visit: Payer: Medicaid Other | Admitting: Family Medicine

## 2017-07-20 HISTORY — PX: COLONOSCOPY: SHX174

## 2017-07-25 ENCOUNTER — Other Ambulatory Visit: Payer: Self-pay | Admitting: Family Medicine

## 2017-07-25 NOTE — Telephone Encounter (Signed)
Copied from CRM [id number]. Topic: Inquiry >> Jul 25, 2017  3:59 PM Windy KalataMichael, Monnie Anspach L, NT wrote: Reason for CRM: pt states she called the office a week ago to get a RX on her adderal. She is trying to find a new doctor since her insurance is no longer covered here. I do not see a message to go off of where she called last week. She is wanting Dr. Clent RidgesFry to write a script for it and will be in [place] tomorrow if she can pick it up then. Please give pt a call back if any questions.

## 2017-07-26 MED ORDER — AMPHETAMINE-DEXTROAMPHET ER 20 MG PO CP24: 20.0000 mg | ORAL_CAPSULE | Freq: Two times a day (BID) | ORAL | 0 refills | Status: DC

## 2017-07-26 NOTE — Telephone Encounter (Signed)
I wrote for one month only. She will need to see her new doctor for any after that

## 2017-07-26 NOTE — Telephone Encounter (Signed)
Pt advised and voiced understanding. Rx placed up front for pick up.  

## 2019-09-13 HISTORY — PX: CHOLECYSTECTOMY, LAPAROSCOPIC: SHX56

## 2021-06-25 ENCOUNTER — Other Ambulatory Visit: Payer: Self-pay

## 2021-06-25 ENCOUNTER — Ambulatory Visit: Payer: Medicaid Other | Admitting: Neurology

## 2021-06-25 ENCOUNTER — Encounter: Payer: Self-pay | Admitting: Neurology

## 2021-06-25 VITALS — BP 97/60 | HR 67 | Ht 66.0 in | Wt 134.0 lb

## 2021-06-25 DIAGNOSIS — G44229 Chronic tension-type headache, not intractable: Secondary | ICD-10-CM | POA: Diagnosis not present

## 2021-06-25 DIAGNOSIS — G43709 Chronic migraine without aura, not intractable, without status migrainosus: Secondary | ICD-10-CM

## 2021-06-25 DIAGNOSIS — F419 Anxiety disorder, unspecified: Secondary | ICD-10-CM | POA: Diagnosis not present

## 2021-06-25 MED ORDER — AMITRIPTYLINE HCL 50 MG PO TABS: 50.0000 mg | ORAL_TABLET | Freq: Every day | ORAL | 0 refills | Status: DC

## 2021-06-25 MED ORDER — NAPROXEN 500 MG PO TABS: 500.0000 mg | ORAL_TABLET | Freq: Two times a day (BID) | ORAL | 2 refills | Status: DC | PRN

## 2021-06-25 MED ORDER — SUMATRIPTAN SUCCINATE 50 MG PO TABS: 50.0000 mg | ORAL_TABLET | ORAL | 0 refills | Status: DC | PRN

## 2021-06-25 NOTE — Patient Instructions (Addendum)
Start with Elavil 1/2 tab nightly for one week then increase to full tab thereafter  Take Sumatriptan and Naproxen together as soon as the headaches start, can take a second dose of Sumatriptan 2 hours later if headaches not resolved  MRI Brain with and without contrast  Follow up with your PMD for psychiatry referral for management of your anxiety  Return in 3 months for follow up

## 2021-06-25 NOTE — Progress Notes (Signed)
[facility]  PATIENT: [name] DOB: 05/25/1991  REFERRING CLINICIAN: Conley Rolls, [place], OD HISTORY FROM: Patient  REASON FOR VISIT: Headaches    HISTORICAL  CHIEF COMPLAINT:  Chief Complaint  Patient presents with   New Patient (Initial Visit)    Pt alone, rm 12. Pt had her eyes examined and she asked about headaches. They normally are behind her eyes. Intermittently she has symptoms where she has light sensitivity and nothing otc can help. She complains of headaches daily and about 4 times a month may have the more migrainous.     HISTORY OF PRESENT ILLNESS:  This is a 30 year old woman with past medical history of ?rheumatoid arthritis, anxiety, headaches who is presenting with management of her headaches.  She states for the past 2 years she has been having daily headaches.  She describes 2 types of headaches.  The first type start behind her eyes and usually at the end of the day, described as throbbing pain, this headache can last a couple hours or even more.  They are associated with photophobia and phonophobia.  She has tried Motrin with minimal relief. The second type are described as sharp shooting pain in bitemporal regions.  Does last a few minutes.  Patient stated that she never been evaluated for headaches.  She only has been taking ibuprofen, cannot tolerate acetaminophen.  She reports she learned how to live with these headaches. She reported being diagnosed with anxiety and depression, she was on antidepression but reported that she did not see any difference therefore she discontinued all medications.  Currently, her only medication she is taking is ibuprofen as needed.  She reported family history of headaches in her mother and grandmother.     Headache History and Characteristics: Onset: Couple years ago Location: Behind right eye, also on bitemporal region. Quality: Pressure behind eye, stabbing, throbbing.  Intensity: 5-6/10.  Duration: couple  hours, sometimes longer Migrainous Features: Photophobia, phonophobia  Aura: No  History of brain injury or tumor: No  Family history: Mother, grandmother  Motion sickness: no Cardiac history: no  OTC: tylenol (unable to tolerate), ibuprofen Sleep: will get about 7 hrs of sleep but she is slight sleeper  Mood/ Stress: Stress is high, always been high   Prior prophylaxis: Propranolol: No  Verapamil:No TCA: No Topamax: No Depakote: No Effexor: No Cymbalta: No Neurontin:No  Prior abortives: Triptan: No Anti-emetic: No Steroids: No Ergotamine suppository: No   OTHER MEDICAL CONDITIONS: RA, Nausea, Anxiety    REVIEW OF SYSTEMS: Full 14 system review of systems performed and negative with exception of: As noted in the HPI  ALLERGIES: No Known Allergies  HOME MEDICATIONS: Outpatient Medications Prior to Visit  Medication Sig Dispense Refill   ibuprofen (ADVIL) 200 MG tablet Take 200-400 mg by mouth every 8 (eight) hours as needed.     ALPRAZolam (XANAX) 0.5 MG tablet TAKE ONE TABLET BY MOUTH THREE TIMES DAILY AS NEEDED FOR ANXIETY 90 tablet 5   amphetamine-dextroamphetamine (ADDERALL XR) 20 MG 24 hr capsule Take 1 capsule (20 mg total) 2 (two) times daily by mouth. 60 capsule 0   fluconazole (DIFLUCAN) 100 MG tablet Take 1 tablet (100 mg total) by mouth 2 (two) times daily. 60 tablet 0   ketoconazole (NIZORAL) 2 % cream Apply 1 application topically daily. 15 g 0   terbinafine (LAMISIL) 250 MG tablet Take 1 tablet (250 mg total) by mouth daily. 14 tablet 0   No facility-administered medications prior to visit.  PAST MEDICAL HISTORY: Past Medical History:  Diagnosis Date   Anemia    with pregnancy   Anxiety    Chronic headaches    began late 2012   Depression    GERD (gastroesophageal reflux disease)    History of kidney stones    x 4, first time age 32yo   Previous cesarean delivery, antepartum condition or complication 04/01/2011   Seasonal allergic rhinitis      PAST SURGICAL HISTORY: Past Surgical History:  Procedure Laterality Date   CESAREAN SECTION     TONSILLECTOMY     TUBAL LIGATION Bilateral 08/14/2015   Procedure: POST PARTUM TUBAL LIGATION;  Surgeon: Geryl Rankins, MD;  Location: [facility];  Service: Gynecology;  Laterality: Bilateral;    FAMILY HISTORY: Family History  Problem Relation Age of Onset   Hypertension Mother    Cancer Mother 31       colon   Diabetes Mother        borderline   Thyroid disease Mother    Depression Mother    Anxiety disorder Mother    Hypertension Maternal Grandfather    Heart disease Maternal Grandfather    Other Father        unknown   Cancer Father        brain cancer?   ADD / ADHD Sister    Heart disease Maternal Uncle    Diabetes Maternal Grandmother    Stroke Neg Hx    Cancer Other        others on maternal side   Heart murmur Daughter     SOCIAL HISTORY: Social History   Socioeconomic History   Marital status: Single    Spouse name: Not on file   Number of children: Not on file   Years of education: Not on file   Highest education level: Not on file  Occupational History   Occupation: Advertising account planner, Secondary school teacher: [employer]  Tobacco Use   Smoking status: Every Day    Years: 7.00    Types: Cigarettes   Smokeless tobacco: Never   Tobacco comments:    4-5 cigarettes per day  Substance and Sexual Activity   Alcohol use: No    Alcohol/week: 0.0 standard drinks   Drug use: No   Sexual activity: Never    Birth control/protection: None  Other Topics Concern   Not on file  Social History Narrative   Single, 2 children, ages 37yo and 27mo, exercise - running after her children   Social Determinants of Health   Financial Resource Strain: Not on file  Food Insecurity: Not on file  Transportation Needs: Not on file  Physical Activity: Not on file  Stress: Not on file  Social Connections: Not on file  Intimate Partner Violence: Not on file      PHYSICAL EXAM  GENERAL EXAM/CONSTITUTIONAL: Vitals:  Vitals:   06/25/21 0849  BP: 97/60  Pulse: 67  Weight: 134 lb (60.8 kg)  Height: 5\' 6"  (1.676 m)   Body mass index is 21.63 kg/m. Wt Readings from Last 3 Encounters:  06/25/21 134 lb (60.8 kg)  08/11/16 141 lb 12.8 oz (64.3 kg)  01/04/16 136 lb (61.7 kg)   Patient is in no distress; well developed, nourished and groomed; neck is supple  EYES: Pupils round and reactive to light, Visual fields full to confrontation, Extraocular movements intacts,   MUSCULOSKELETAL: Gait, strength, tone, movements noted in Neurologic exam below  NEUROLOGIC: MENTAL STATUS:  No flowsheet  data found. awake, alert, oriented to person, place and time recent and remote memory intact normal attention and concentration language fluent, comprehension intact, naming intact fund of knowledge appropriate  CRANIAL NERVE:  2nd - no papilledema or hemorrhages on fundoscopic exam 2nd, 3rd, 4th, 6th - pupils equal and reactive to light, visual fields full to confrontation, extraocular muscles intact, no nystagmus 5th - facial sensation symmetric 7th - facial strength symmetric 8th - hearing intact 9th - palate elevates symmetrically, uvula midline 11th - shoulder shrug symmetric 12th - tongue protrusion midline  MOTOR:  normal bulk and tone, full strength in the BUE, BLE  SENSORY:  normal and symmetric to light touch, pinprick, temperature, vibration  COORDINATION:  finger-nose-finger, fine finger movements normal  REFLEXES:  deep tendon reflexes present and symmetric  GAIT/STATION:  normal    DIAGNOSTIC DATA (LABS, IMAGING, TESTING) - I reviewed patient records, labs, notes, testing and imaging myself where available.  Lab Results  Component Value Date   WBC 8.3 01/04/2016   HGB 15.0 01/04/2016   HCT 44.4 01/04/2016   MCV 90.2 01/04/2016   PLT 285.0 01/04/2016      Component Value Date/Time   NA 142 01/04/2016 0935    K 4.2 01/04/2016 0935   CL 105 01/04/2016 0935   CO2 27 01/04/2016 0935   GLUCOSE 81 01/04/2016 0935   BUN 10 01/04/2016 0935   CREATININE 0.73 01/04/2016 0935   CREATININE 0.76 12/14/2011 1024   CALCIUM 9.8 01/04/2016 0935   PROT 7.3 01/04/2016 0935   ALBUMIN 4.6 01/04/2016 0935   AST 10 01/04/2016 0935   ALT 10 01/04/2016 0935   ALKPHOS 66 01/04/2016 0935   BILITOT 0.6 01/04/2016 0935   GFRNONAA >90 01/23/2013 1216   GFRAA >90 01/23/2013 1216   Lab Results  Component Value Date   CHOL 99 12/14/2011   HDL 36 (L) 12/14/2011   LDLCALC 56 12/14/2011   TRIG 37 12/14/2011   CHOLHDL 2.8 12/14/2011   No results found for: HGBA1C Lab Results  Component Value Date   VITAMINB12 446 01/04/2016   Lab Results  Component Value Date   TSH 0.47 01/04/2016      ASSESSMENT AND PLAN  30 y.o. year old female with past medical history of anxiety/depression, headaches who is presenting for management of her headaches.  Patient describes 2 types of headaches, type I located behind eyes with associated photophobia phonophobia lasting couple hours and type II in the bitemporal regions and these are described as sharp shooting pain lasting minutes.  She is reporting daily headaches.  Never been on a preventive medication.  I will start the patient on amitriptyline to take nightly and also sumatriptan with naproxen to take as needed with the headaches.  I will also obtain a brain MRI with and without contrast for further evaluation.  I will call the patient to go over the results otherwise I will see her in 3 months for follow-up. For anxiety and depression I advised her to follow-up with a primary care doctor for referral to psychiatry, she is agreeable with plans   1. Chronic tension-type headache, not intractable   2. Chronic migraine w/o aura w/o status migrainosus, not intractable   3. Anxiety     PLAN: Start with Elavil 1/2 tab nightly for one week then increase to full tab thereafter   Take Sumatriptan and Naproxen together as soon as the headaches start, can take a second dose of Sumatriptan 2 hours later if headaches not resolved  MRI Brain with and without contrast  Follow up with your PMD for psychiatry referral for management of your anxiety  Return in 3 months for follow up  Orders Placed This Encounter  Procedures   MR BRAIN W WO CONTRAST    Meds ordered this encounter  Medications   amitriptyline (ELAVIL) 50 MG tablet    Sig: Take 1 tablet (50 mg total) by mouth at bedtime.    Dispense:  90 tablet    Refill:  0   SUMAtriptan (IMITREX) 50 MG tablet    Sig: Take 1 tablet (50 mg total) by mouth every 2 (two) hours as needed for migraine. May repeat in 2 hours if headache persists or recurs.    Dispense:  10 tablet    Refill:  0   naproxen (NAPROSYN) 500 MG tablet    Sig: Take 1 tablet (500 mg total) by mouth every 12 (twelve) hours as needed.    Dispense:  60 tablet    Refill:  2    Return in about 3 months (around 09/25/2021).    Windell Norfolk, MD 06/25/2021, 9:20 AM  [facility] [address] [place], [place] [address] [phone]

## 2021-07-11 ENCOUNTER — Other Ambulatory Visit: Payer: Self-pay

## 2021-07-11 ENCOUNTER — Ambulatory Visit
Admission: RE | Admit: 2021-07-11 | Discharge: 2021-07-11 | Disposition: A | Discharge status: Home / Self Care | Payer: Self-pay | Source: Ambulatory Visit | Attending: Neurology | Admitting: Neurology

## 2021-07-11 DIAGNOSIS — G44229 Chronic tension-type headache, not intractable: Secondary | ICD-10-CM

## 2021-07-11 DIAGNOSIS — G43709 Chronic migraine without aura, not intractable, without status migrainosus: Secondary | ICD-10-CM

## 2021-07-11 MED ORDER — GADOBENATE DIMEGLUMINE 529 MG/ML IV SOLN
12.0000 mL | Freq: Once | INTRAVENOUS | Status: AC | PRN
  Administered 2021-07-11: 12 mL via INTRAVENOUS

## 2021-07-19 ENCOUNTER — Telehealth: Payer: Self-pay | Admitting: Neurology

## 2021-07-19 NOTE — Telephone Encounter (Signed)
Returned patient call. MRI Brain is normal. I had sent a mychart message last week.   Windell Norfolk, MD

## 2021-07-19 NOTE — Telephone Encounter (Signed)
Pt called wanting to know if we have received her MRI results. Pt requesting a call back.

## 2021-09-27 ENCOUNTER — Ambulatory Visit: Payer: Medicaid Other | Admitting: Neurology

## 2021-09-28 ENCOUNTER — Encounter: Payer: Self-pay | Admitting: Neurology

## 2021-11-11 ENCOUNTER — Telehealth: Payer: Self-pay | Admitting: Internal Medicine

## 2021-11-11 NOTE — Telephone Encounter (Signed)
Patient called in stating that Dr.Fry informed mother that she could become a patient of his again.  ? ?Mother name: Danelle Earthly ? ?Is this true? ? ? ?Please advise. ?

## 2021-11-11 NOTE — Telephone Encounter (Signed)
Please advise 

## 2021-11-11 NOTE — Telephone Encounter (Signed)
Yes I would be happy to see her again  

## 2021-11-12 NOTE — Telephone Encounter (Signed)
FYI

## 2021-11-19 ENCOUNTER — Encounter: Payer: Self-pay | Admitting: Family Medicine

## 2021-11-19 ENCOUNTER — Ambulatory Visit (INDEPENDENT_AMBULATORY_CARE_PROVIDER_SITE_OTHER): Payer: Medicaid Other | Admitting: Family Medicine

## 2021-11-19 VITALS — BP 102/78 | HR 70 | Temp 98.9°F | Ht 65.0 in | Wt 129.4 lb

## 2021-11-19 DIAGNOSIS — F900 Attention-deficit hyperactivity disorder, predominantly inattentive type: Secondary | ICD-10-CM | POA: Diagnosis not present

## 2021-11-19 DIAGNOSIS — K625 Hemorrhage of anus and rectum: Secondary | ICD-10-CM | POA: Diagnosis not present

## 2021-11-19 DIAGNOSIS — Z86010 Personal history of colonic polyps: Secondary | ICD-10-CM | POA: Diagnosis not present

## 2021-11-19 DIAGNOSIS — F419 Anxiety disorder, unspecified: Secondary | ICD-10-CM

## 2021-11-19 MED ORDER — AMPHETAMINE-DEXTROAMPHETAMINE 10 MG PO TABS: 10.0000 mg | ORAL_TABLET | Freq: Two times a day (BID) | ORAL | 0 refills | Status: DC

## 2021-11-19 MED ORDER — HYDROXYZINE HCL 10 MG PO TABS: 10.0000 mg | ORAL_TABLET | Freq: Every day | ORAL | 5 refills | Status: DC

## 2021-11-19 NOTE — Progress Notes (Signed)
? ?Subjective:  ? ? Patient ID: [name], female    DOB: 04-Nov-1990, 31 y.o.   MRN: [id number] ? ?HPI ?Here to establish with [place] again for primary care after transferring from Dr. Quitman Livings in [place], [place]. We took care of her for years when she was younger, the last time in July 2017. She has a few requests today. First she has a long hx of anxiety, and she had taken Xanax some years ago. Lately she has been taking Hydroxyzine at bedtime, and this works very well for her. She also has ADHD, and she took Adderall XR 20 mg BID for years, but she has been off this for several years. She wants to get back on it because she struggles with focus and with keeping up with her many responsibilities. She has three children, ages 83 and 35 and 39, and she is a self-employed Financial trader. 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 Otherwise she had 3 days of painless bright red blood from the rectum in January, and she was seen by [facility] for this. No procedures were performed, and the bleeding stopped. She has had no further bleeding since then. She had her first colonoscopy in 2018 because her mother had colon cancer, and it was recommended that she begin getting these 10 years before reaching the age that her mother was when the cancer was found. This showed only a benign polyp, but she plans to have this done every 5 years from now on. Otherwise for the past year she has had intermittent swelling in the hands and feet. She has stiffness in the hands and feet as well, but there is no pain. She says she has labs to look for rheumatoid arthritis, but these were all normal.  ? ? ?Review of Systems  ?Constitutional: Negative.   ?Respiratory: Negative.    ?Cardiovascular: Negative.   ?Gastrointestinal:  Positive for anal bleeding. Negative for abdominal distention, abdominal pain, blood in stool, constipation, diarrhea, nausea, rectal pain and  vomiting.  ?Genitourinary: Negative.   ?Neurological: Negative.   ?Psychiatric/Behavioral:  Positive for decreased concentration. Negative for agitation, behavioral problems, confusion, dysphoric mood, hallucinations and sleep disturbance. The patient is nervous/anxious.   ? ?   ?Objective:  ? Physical Exam ?Constitutional:   ?   Appearance: Normal appearance.  ?Cardiovascular:  ?   Rate and Rhythm: Normal rate and regular rhythm.  ?   Pulses: Normal pulses.  ?   Heart sounds: Normal heart sounds.  ?Pulmonary:  ?   Effort: Pulmonary effort is normal.  ?   Breath sounds: Normal breath sounds.  ?Musculoskeletal:  ?   Right lower leg: No edema.  ?   Left lower leg: No edema.  ?Neurological:  ?   General: No focal deficit present.  ?   Mental Status: She is alert and oriented to person, place, and time.  ?Psychiatric:     ?   Mood and Affect: Mood normal.     ?   Behavior: Behavior normal.     ?   Thought Content: Thought content normal.  ? ? ? ? ? ?   ?Assessment & Plan:  ?Intro visit with this patient who has anxiety and ADHD. We refilled her Hydroxyzine to take at bedtime. We also wrote for her to try Adderall 10 mg BID, since she says the 20 mg dose always interfered with her sleep. She wants  to change to [facility] for her GI care, so we will do a referral. She will arrange to have her medical records sent to [place]. She will set up a wellness exam soon with fasting labs.  ?Gershon Crane, MD ? ? ?

## 2021-11-21 ENCOUNTER — Encounter: Payer: Self-pay | Admitting: Family Medicine

## 2021-11-22 NOTE — Telephone Encounter (Signed)
I reviewed the notes, and I cannot tell what they did exactly either. She should ask these questions to her GYN (Dr. Dion Body)  ?

## 2021-12-10 ENCOUNTER — Encounter: Payer: Self-pay | Admitting: Family Medicine

## 2021-12-10 ENCOUNTER — Ambulatory Visit (INDEPENDENT_AMBULATORY_CARE_PROVIDER_SITE_OTHER): Payer: Medicaid Other | Admitting: Family Medicine

## 2021-12-10 VITALS — BP 98/70 | HR 68 | Temp 98.7°F | Ht 65.0 in | Wt 128.2 lb

## 2021-12-10 DIAGNOSIS — Z0000 Encounter for general adult medical examination without abnormal findings: Secondary | ICD-10-CM

## 2021-12-10 LAB — BASIC METABOLIC PANEL
BUN: 13 mg/dL (ref 6–23)
CO2: 27 mEq/L (ref 19–32)
Calcium: 9.6 mg/dL (ref 8.4–10.5)
Chloride: 104 mEq/L (ref 96–112)
Creatinine, Ser: 0.78 mg/dL (ref 0.40–1.20)
GFR: 100.5 mL/min (ref >60.00)
Glucose, Bld: 85 mg/dL (ref 70–99)
Potassium: 4.2 mEq/L (ref 3.5–5.1)
Sodium: 138 mEq/L (ref 135–145)

## 2021-12-10 LAB — CBC WITH DIFFERENTIAL/PLATELET
Basophils Absolute: 0 10*3/uL (ref 0.0–0.1)
Basophils Relative: 0.6 % (ref 0.0–3.0)
Eosinophils Absolute: 0.1 10*3/uL (ref 0.0–0.7)
Eosinophils Relative: 1.2 % (ref 0.0–5.0)
HCT: 42.6 % (ref 36.0–46.0)
Hemoglobin: 14.8 g/dL (ref 12.0–15.0)
Lymphocytes Relative: 29.5 % (ref 12.0–46.0)
Lymphs Abs: 1.8 10*3/uL (ref 0.7–4.0)
MCHC: 34.7 g/dL (ref 30.0–36.0)
MCV: 90.2 fl (ref 78.0–100.0)
Monocytes Absolute: 0.4 10*3/uL (ref 0.1–1.0)
Monocytes Relative: 6.8 % (ref 3.0–12.0)
Neutro Abs: 3.8 10*3/uL (ref 1.4–7.7)
Neutrophils Relative %: 61.9 % (ref 43.0–77.0)
Platelets: 263 10*3/uL (ref 150.0–400.0)
RBC: 4.73 Mil/uL (ref 3.87–5.11)
RDW: 12.7 % (ref 11.5–15.5)
WBC: 6.1 10*3/uL (ref 4.0–10.5)

## 2021-12-10 LAB — LIPID PANEL
Cholesterol: 126 mg/dL (ref 0–200)
HDL: 55.3 mg/dL (ref >39.00)
LDL Cholesterol: 63 mg/dL (ref 0–99)
NonHDL: 70.77
Total CHOL/HDL Ratio: 2
Triglycerides: 40 mg/dL (ref 0.0–149.0)
VLDL: 8 mg/dL (ref 0.0–40.0)

## 2021-12-10 LAB — HEPATIC FUNCTION PANEL
ALT: 12 U/L (ref 0–35)
AST: 12 U/L (ref 0–37)
Albumin: 4.5 g/dL (ref 3.5–5.2)
Alkaline Phosphatase: 62 U/L (ref 39–117)
Bilirubin, Direct: 0.2 mg/dL (ref 0.0–0.3)
Total Bilirubin: 0.6 mg/dL (ref 0.2–1.2)
Total Protein: 7 g/dL (ref 6.0–8.3)

## 2021-12-10 LAB — TSH: TSH: 1.12 u[IU]/mL (ref 0.35–5.50)

## 2021-12-10 LAB — HEMOGLOBIN A1C: Hgb A1c MFr Bld: 5.3 % (ref 4.6–6.5)

## 2021-12-10 MED ORDER — AMPHETAMINE-DEXTROAMPHETAMINE 10 MG PO TABS: 10.0000 mg | ORAL_TABLET | Freq: Every evening | ORAL | 0 refills | Status: DC

## 2021-12-10 MED ORDER — HYDROXYZINE HCL 10 MG PO TABS: 10.0000 mg | ORAL_TABLET | Freq: Two times a day (BID) | ORAL | 3 refills | Status: DC

## 2021-12-10 MED ORDER — DOCUSATE SODIUM 100 MG PO CAPS: 100.0000 mg | ORAL_CAPSULE | Freq: Two times a day (BID) | ORAL | 3 refills | Status: DC

## 2021-12-10 MED ORDER — AMPHETAMINE-DEXTROAMPHETAMINE 15 MG PO TABS: 15.0000 mg | ORAL_TABLET | Freq: Every morning | ORAL | 0 refills | Status: DC

## 2021-12-10 NOTE — Progress Notes (Signed)
? ?Subjective:  ? ? Patient ID: [name], female    DOB: Sep 04, 1991, 31 y.o.   MRN: [id number] ? ?HPI ?Here for a well exam. She has a few issues to discuss. First at our last visit she had tried Adderall 10 mg BID but she asks if the morning dose can be increased slightly. Also she tends to be constipated. She drinks water and gets fiber in her diet. She says she cannot take powders like Metamucil because this "weirds her out". She also has some stiffness and mild pain both hands in the mornings and this eases up during the day. Her joints do not swell.  ? ? ?Review of Systems  ?Constitutional: Negative.   ?HENT: Negative.    ?Eyes: Negative.   ?Respiratory: Negative.    ?Cardiovascular: Negative.   ?Gastrointestinal:  Positive for constipation.  ?Genitourinary:  Negative for decreased urine volume, difficulty urinating, dyspareunia, dysuria, enuresis, flank pain, frequency, hematuria, pelvic pain and urgency.  ?Musculoskeletal:  Positive for arthralgias.  ?Skin: Negative.   ?Neurological: Negative.  Negative for headaches.  ?Psychiatric/Behavioral: Negative.    ? ?   ?Objective:  ? Physical Exam ?Constitutional:   ?   General: She is not in acute distress. ?   Appearance: Normal appearance. She is well-developed.  ?HENT:  ?   Head: Normocephalic and atraumatic.  ?   Right Ear: External ear normal.  ?   Left Ear: External ear normal.  ?   Nose: Nose normal.  ?   Mouth/Throat:  ?   Pharynx: No oropharyngeal exudate.  ?Eyes:  ?   General: No scleral icterus. ?   Conjunctiva/sclera: Conjunctivae normal.  ?   Pupils: Pupils are equal, round, and reactive to light.  ?Neck:  ?   Thyroid: No thyromegaly.  ?   Vascular: No JVD.  ?Cardiovascular:  ?   Rate and Rhythm: Normal rate and regular rhythm.  ?   Heart sounds: Normal heart sounds. No murmur heard. ?  No friction rub. No gallop.  ?Pulmonary:  ?   Effort: Pulmonary effort is normal. No respiratory distress.  ?   Breath sounds: Normal breath sounds. No  wheezing or rales.  ?Chest:  ?   Chest wall: No tenderness.  ?Abdominal:  ?   General: Bowel sounds are normal. There is no distension.  ?   Palpations: Abdomen is soft. There is no mass.  ?   Tenderness: There is no abdominal tenderness. There is no guarding or rebound.  ?Musculoskeletal:     ?   General: No tenderness. Normal range of motion.  ?   Cervical back: Normal range of motion and neck supple.  ?Lymphadenopathy:  ?   Cervical: No cervical adenopathy.  ?Skin: ?   General: Skin is warm and dry.  ?   Findings: No erythema or rash.  ?Neurological:  ?   Mental Status: She is alert and oriented to person, place, and time.  ?   Cranial Nerves: No cranial nerve deficit.  ?   Motor: No abnormal muscle tone.  ?   Coordination: Coordination normal.  ?   Deep Tendon Reflexes: Reflexes are normal and symmetric. Reflexes normal.  ?Psychiatric:     ?   Behavior: Behavior normal.     ?   Thought Content: Thought content normal.     ?   Judgment: Judgment normal.  ? ? ? ? ? ?   ?Assessment & Plan:  ?Well exam. We discussed diet and exercise. Get  fasting labs. For the ADHD, she will try taking Adderall 15 mg in the mornings and 10 mg in the afternoons. For the constipation, she will try Colace 100 mg BID. She seems to have some mild arthritis in the hands, so she will try Aleve in the mornings as needed. ?Alysia Penna, MD ? ? ?

## 2022-01-10 ENCOUNTER — Other Ambulatory Visit: Payer: Self-pay | Admitting: Family Medicine

## 2022-01-10 NOTE — Telephone Encounter (Signed)
Pt LOV was on 12/10/2021 ?Last refill done on 12/09/2021 ?Please advise ?

## 2022-01-11 MED ORDER — AMPHETAMINE-DEXTROAMPHETAMINE 10 MG PO TABS: 10.0000 mg | ORAL_TABLET | Freq: Every evening | ORAL | 0 refills | Status: DC

## 2022-01-11 MED ORDER — AMPHETAMINE-DEXTROAMPHETAMINE 15 MG PO TABS: 15.0000 mg | ORAL_TABLET | Freq: Every morning | ORAL | 0 refills | Status: DC

## 2022-01-11 NOTE — Telephone Encounter (Signed)
Done

## 2022-03-04 ENCOUNTER — Encounter: Payer: Self-pay | Admitting: Gastroenterology

## 2022-03-04 ENCOUNTER — Ambulatory Visit (INDEPENDENT_AMBULATORY_CARE_PROVIDER_SITE_OTHER): Payer: Medicaid Other | Admitting: Gastroenterology

## 2022-03-04 VITALS — HR 102 | Ht 65.0 in | Wt 133.0 lb

## 2022-03-04 DIAGNOSIS — K649 Unspecified hemorrhoids: Secondary | ICD-10-CM

## 2022-03-04 DIAGNOSIS — K581 Irritable bowel syndrome with constipation: Secondary | ICD-10-CM | POA: Diagnosis not present

## 2022-03-04 DIAGNOSIS — K921 Melena: Secondary | ICD-10-CM | POA: Diagnosis not present

## 2022-03-04 DIAGNOSIS — Z800 Family history of malignant neoplasm of digestive organs: Secondary | ICD-10-CM

## 2022-03-04 MED ORDER — DICYCLOMINE HCL 20 MG PO TABS: 20.0000 mg | ORAL_TABLET | Freq: Four times a day (QID) | ORAL | 1 refills | Status: DC

## 2022-03-04 MED ORDER — NA SULFATE-K SULFATE-MG SULF 17.5-3.13-1.6 GM/177ML PO SOLN: 1.0000 | Freq: Once | ORAL | 0 refills | Status: AC

## 2022-03-10 ENCOUNTER — Encounter: Payer: Self-pay | Admitting: Gastroenterology

## 2022-03-17 ENCOUNTER — Encounter: Payer: Self-pay | Admitting: Gastroenterology

## 2022-03-17 ENCOUNTER — Ambulatory Visit (AMBULATORY_SURGERY_CENTER): Payer: Medicaid Other | Admitting: Gastroenterology

## 2022-03-17 VITALS — BP 102/60 | HR 71 | Temp 99.1°F | Resp 21 | Ht 65.0 in | Wt 133.0 lb

## 2022-03-17 DIAGNOSIS — Z800 Family history of malignant neoplasm of digestive organs: Secondary | ICD-10-CM

## 2022-03-17 DIAGNOSIS — Z1211 Encounter for screening for malignant neoplasm of colon: Secondary | ICD-10-CM | POA: Diagnosis not present

## 2022-03-17 DIAGNOSIS — K649 Unspecified hemorrhoids: Secondary | ICD-10-CM

## 2022-03-17 DIAGNOSIS — K921 Melena: Secondary | ICD-10-CM

## 2022-03-17 DIAGNOSIS — K581 Irritable bowel syndrome with constipation: Secondary | ICD-10-CM

## 2022-03-17 HISTORY — PX: COLONOSCOPY: SHX174

## 2022-03-17 MED ORDER — SODIUM CHLORIDE 0.9 % IV SOLN: 500.0000 mL | Freq: Once | INTRAVENOUS | Status: DC

## 2022-03-17 NOTE — Progress Notes (Signed)
Sedate, gd SR, tolerated procedure well, VSS, report to RN 

## 2022-03-17 NOTE — Progress Notes (Signed)
Pt's states no medical or surgical changes since previsit or office visit. 

## 2022-03-17 NOTE — Progress Notes (Signed)
History and Physical Interval Note:  03/17/2022 3:41 PM  [name]  has presented today for endoscopic procedure(s), with the diagnosis of  Encounter Diagnoses  Name Primary?   Family history of colorectal cancer Yes   Hematochezia    Hemorrhoids, unspecified hemorrhoid type    Irritable bowel syndrome with constipation   .  The various methods of evaluation and treatment have been discussed with the patient and/or family. After consideration of risks, benefits and other options for treatment, the patient has consented to  the endoscopic procedure(s).   The patient's history has been reviewed, patient examined, no change in status, stable for endoscopic procedure(s).  I have reviewed the patient's chart and labs.  Questions were answered to the patient's satisfaction.     Avaleen Brownley E. Tomasa Rand, MD [facility]

## 2022-03-17 NOTE — Patient Instructions (Signed)
Handout on hemorrhoids given.  YOU HAD AN ENDOSCOPIC PROCEDURE TODAY AT THE [facility]:   Refer to the procedure report that was given to you for any specific questions about what was found during the examination.  If the procedure report does not answer your questions, please call your gastroenterologist to clarify.  If you requested that your care partner not be given the details of your procedure findings, then the procedure report has been included in a sealed envelope for you to review at your convenience later.  YOU SHOULD EXPECT: Some feelings of bloating in the abdomen. Passage of more gas than usual.  Walking can help get rid of the air that was put into your GI tract during the procedure and reduce the bloating. If you had a lower endoscopy (such as a colonoscopy or flexible sigmoidoscopy) you may notice spotting of blood in your stool or on the toilet paper. If you underwent a bowel prep for your procedure, you may not have a normal bowel movement for a few days.  Please Note:  You might notice some irritation and congestion in your nose or some drainage.  This is from the oxygen used during your procedure.  There is no need for concern and it should clear up in a day or so.  SYMPTOMS TO REPORT IMMEDIATELY:  Following lower endoscopy (colonoscopy or flexible sigmoidoscopy):  Excessive amounts of blood in the stool  Significant tenderness or worsening of abdominal pains  Swelling of the abdomen that is new, acute  Fever of 100F or higher  For urgent or emergent issues, a gastroenterologist can be reached at any hour by calling [phone]. Do not use MyChart messaging for urgent concerns.    DIET:  We do recommend a small meal at first, but then you may proceed to your regular diet.  Drink plenty of fluids but you should avoid alcoholic beverages for 24 hours.  ACTIVITY:  You should plan to take it easy for the rest of today and you should NOT DRIVE or use heavy  machinery until tomorrow (because of the sedation medicines used during the test).    FOLLOW UP: Our staff will call the number listed on your records the next business day following your procedure.  We will call around 7:15- 8:00 am to check on you and address any questions or concerns that you may have regarding the information given to you following your procedure. If we do not reach you, we will leave a message.  If you develop any symptoms (ie: fever, flu-like symptoms, shortness of breath, cough etc.) before then, please call [phone].  If you test positive for Covid 19 in the 2 weeks post procedure, please call and report this information to us.    If any biopsies were taken you will be contacted by phone or by letter within the next 1-3 weeks.  Please call us at [phone] if you have not heard about the biopsies in 3 weeks.    SIGNATURES/CONFIDENTIALITY: You and/or your care partner have signed paperwork which will be entered into your electronic medical record.  These signatures attest to the fact that that the information above on your After Visit Summary has been reviewed and is understood.  Full responsibility of the confidentiality of this discharge information lies with you and/or your care-partner.  

## 2022-03-17 NOTE — Op Note (Signed)
[facility] Patient Name: [name] Procedure Date: 03/17/2022 3:39 PM MRN: [id number] Endoscopist: Lorin Picket E. Tomasa Rand , MD Age: 31 Referring MD:  Date of Birth: [birth year] Gender: Female Account #: [id number] Procedure:                Colonoscopy Indications:              Screening in patient at increased risk: Colorectal                            cancer in mother before age 2 Medicines:                Monitored Anesthesia Care Procedure:                Pre-Anesthesia Assessment:                           - Prior to the procedure, a History and Physical                            was performed, and patient medications and                            allergies were reviewed. The patient's tolerance of                            previous anesthesia was also reviewed. The risks                            and benefits of the procedure and the sedation                            options and risks were discussed with the patient.                            All questions were answered, and informed consent                            was obtained. Prior Anticoagulants: The patient has                            taken no previous anticoagulant or antiplatelet                            agents. ASA Grade Assessment: II - A patient with                            mild systemic disease. After reviewing the risks                            and benefits, the patient was deemed in                            satisfactory condition to undergo the procedure.  After obtaining informed consent, the colonoscope                            was passed under direct vision. Throughout the                            procedure, the patient's blood pressure, pulse, and                            oxygen saturations were monitored continuously. The                            Olympus PCF-H190DL (#[id number]) Colonoscope was                            introduced through the anus  and advanced to the the                            cecum, identified by appendiceal orifice and                            ileocecal valve. The colonoscopy was somewhat                            difficult due to a tortuous colon. Successful                            completion of the procedure was aided by using                            manual pressure. The patient tolerated the                            procedure well. The quality of the bowel                            preparation was adequate. The ileocecal valve,                            appendiceal orifice, and rectum were photographed.                            The bowel preparation used was SUPREP via split                            dose instruction. Scope In: 3:48:58 PM Scope Out: 4:09:40 PM Scope Withdrawal Time: 0 hours 11 minutes 28 seconds  Total Procedure Duration: 0 hours 20 minutes 42 seconds  Findings:                 Hemorrhoids were found on perianal exam.                           The digital rectal exam was normal. Pertinent  negatives include normal sphincter tone and no                            palpable rectal lesions.                           The colon (entire examined portion) appeared normal.                           Non-bleeding internal hemorrhoids were found during                            retroflexion. The hemorrhoids were Grade III                            (internal hemorrhoids that prolapse but require                            manual reduction).                           No additional abnormalities were found on                            retroflexion. Complications:            No immediate complications. Estimated Blood Loss:     Estimated blood loss: none. Impression:               - Hemorrhoids found on perianal exam.                           - The entire examined colon is normal.                           - Non-bleeding internal hemorrhoids.                            - No specimens collected. Recommendation:           - Patient has a contact number available for                            emergencies. The signs and symptoms of potential                            delayed complications were discussed with the                            patient. Return to normal activities tomorrow.                            Written discharge instructions were provided to the                            patient.                           -  Resume previous diet.                           - Continue present medications.                           - Repeat colonoscopy in 5 years for screening                            purposes.                           - Ok to schedule hemorrhoid banding if desired. Annagrace Carr E. Tomasa Rand, MD 03/17/2022 4:14:55 PM This report has been signed electronically.

## 2022-03-18 ENCOUNTER — Telehealth: Payer: Self-pay | Admitting: *Deleted

## 2022-03-18 NOTE — Telephone Encounter (Signed)
Post procedure follow up call placed, no answer and left VM.  

## 2022-05-13 ENCOUNTER — Telehealth: Payer: Self-pay | Admitting: Gastroenterology

## 2022-05-13 NOTE — Telephone Encounter (Signed)
Left message on machine to call back  

## 2022-05-13 NOTE — Telephone Encounter (Signed)
Patient called, states he would like to schedule banding appointment. Requesting a call back.

## 2022-05-17 NOTE — Telephone Encounter (Signed)
Pt did not return call, will await further communication from pt. 

## 2022-05-17 NOTE — Telephone Encounter (Signed)
Pt scheduled for hem banding with Dr. Tomasa Rand 05/21/22 at 9:30am. Pt aware of appt.

## 2022-05-24 ENCOUNTER — Ambulatory Visit: Payer: Medicaid Other | Admitting: Family Medicine

## 2022-06-06 ENCOUNTER — Other Ambulatory Visit: Payer: Self-pay | Admitting: Family Medicine

## 2022-06-07 MED ORDER — AMPHETAMINE-DEXTROAMPHETAMINE 15 MG PO TABS: 15.0000 mg | ORAL_TABLET | Freq: Every morning | ORAL | 0 refills | Status: DC

## 2022-06-07 MED ORDER — AMPHETAMINE-DEXTROAMPHETAMINE 10 MG PO TABS
10.0000 mg | ORAL_TABLET | Freq: Every evening | ORAL | 0 refills | Status: DC
Start: 2022-07-07 — End: 2022-06-07

## 2022-06-07 MED ORDER — AMPHETAMINE-DEXTROAMPHETAMINE 10 MG PO TABS: 10.0000 mg | ORAL_TABLET | Freq: Every evening | ORAL | 0 refills | Status: DC

## 2022-06-07 NOTE — Telephone Encounter (Signed)
Done

## 2022-06-07 NOTE — Telephone Encounter (Signed)
Last OV-12/10/21 Last refills-03/13/22- both medications  No future Ov scheduled.

## 2022-06-20 ENCOUNTER — Encounter: Payer: Self-pay | Admitting: Gastroenterology

## 2022-06-20 ENCOUNTER — Ambulatory Visit (INDEPENDENT_AMBULATORY_CARE_PROVIDER_SITE_OTHER): Payer: Medicaid Other | Admitting: Gastroenterology

## 2022-06-20 VITALS — BP 116/76 | HR 82 | Ht 65.0 in | Wt 128.0 lb

## 2022-06-20 DIAGNOSIS — K642 Third degree hemorrhoids: Secondary | ICD-10-CM

## 2022-06-20 NOTE — Progress Notes (Signed)
PROCEDURE NOTE: The patient presents with symptomatic grade 3  hemorrhoids, requesting rubber band ligation of his/her hemorrhoidal disease.  All risks, benefits and alternative forms of therapy were described and informed consent was obtained.  Alba present during the examination and procedure.   The anorectum was pre-medicated with topical lidocaine (5%) and nitroglycerin (0.125%) The decision was made to band the left lateral internal hemorrhoid, and the [place] was used to perform band ligation without complication.  Digital anorectal examination was then performed to assure proper positioning of the band, and to adjust the banded tissue as required.  The patient was discharged home without pain or other issues.  Dietary and behavioral recommendations were given and along with follow-up instructions.     The following adjunctive treatments were recommended:  Fiber supplementation, stool softeners (colace) Adequate water intake Avoidance of straining and hard stools  The patient will return in 2-4 weeks for follow-up and possible additional banding as required. No complications were encountered and the patient tolerated the procedure well.

## 2022-06-20 NOTE — Patient Instructions (Addendum)
_______________________________________________________  If you are age 33 or older, your body mass index should be between 23-30. Your Body mass index is 21.3 kg/m. If this is out of the aforementioned range listed, please consider follow up with your Primary Care Provider.  If you are age 58 or younger, your body mass index should be between 19-25. Your Body mass index is 21.3 kg/m. If this is out of the aformentioned range listed, please consider follow up with your Primary Care Provider.   HEMORRHOID BANDING PROCEDURE    FOLLOW-UP CARE   The procedure you have had should have been relatively painless since the banding of the area involved does not have nerve endings and there is no pain sensation.  The rubber band cuts off the blood supply to the hemorrhoid and the band may fall off as soon as 48 hours after the banding (the band may occasionally be seen in the toilet bowl following a bowel movement). You may notice a temporary feeling of fullness in the rectum which should respond adequately to plain Tylenol or Motrin.  Following the banding, avoid strenuous exercise that evening and resume full activity the next day.  A sitz bath (soaking in a warm tub) or bidet is soothing, and can be useful for cleansing the area after bowel movements.     To avoid constipation, take two tablespoons of natural wheat bran, natural oat bran, flax, Benefiber or any over the counter fiber supplement and increase your water intake to 7-8 glasses daily.    Unless you have been prescribed anorectal medication, do not put anything inside your rectum for two weeks: No suppositories, enemas, fingers, etc.  Occasionally, you may have more bleeding than usual after the banding procedure.  This is often from the untreated hemorrhoids rather than the treated one.  Don't be concerned if there is a tablespoon or so of blood.  If there is more blood than this, lie flat with your bottom higher than your head and apply  an ice pack to the area. If the bleeding does not stop within a half an hour or if you feel faint, call our office at [phone] or go to the emergency room.  Problems are not common; however, if there is a substantial amount of bleeding, severe pain, chills, fever or difficulty passing urine (very rare) or other problems, you should call us at [phone] or report to the nearest emergency room.  Do not stay seated continuously for more than 2-3 hours for a day or two after the procedure.  Tighten your buttock muscles 10-15 times every two hours and take 10-15 deep breaths every 1-2 hours.  Do not spend more than a few minutes on the toilet if you cannot empty your bowel; instead re-visit the toilet at a later time.    The [place] [facility] providers would like to encourage you to use [facility] to communicate with providers for non-urgent requests or questions.  Due to long hold times on the telephone, sending your provider a message by [facility] may be a faster and more efficient way to get a response.  Please allow 48 business hours for a response.  Please remember that this is for non-urgent requests.   It was a pleasure to see you today!  Thank you for trusting me with your gastrointestinal care!    Scott E.Candis Schatz, MD

## 2022-07-29 ENCOUNTER — Encounter: Payer: Self-pay | Admitting: Gastroenterology

## 2022-07-29 ENCOUNTER — Ambulatory Visit: Payer: Medicaid Other | Admitting: Gastroenterology

## 2022-07-29 VITALS — BP 98/65 | HR 78 | Ht 65.0 in | Wt 127.0 lb

## 2022-07-29 DIAGNOSIS — K642 Third degree hemorrhoids: Secondary | ICD-10-CM | POA: Diagnosis not present

## 2022-07-29 NOTE — Progress Notes (Signed)
Ms. Stuckert presents today for repeat hemorrhoid banding.  She underwent her initial band ligation of the left lateral hemorrhoid column on October 9.  She tolerated the procedure well and had no discomfort following band placement.  She reports minimal improvement in her hemorrhoidal symptoms, namely perianal pressure and prolapse symptoms.  She has occasional rectal bleeding, which comes and goes.  We will plan to band her right posterior column today to see if this provides more improvement.  PROCEDURE NOTE: The patient presents with symptomatic grade 3  hemorrhoids, requesting rubber band ligation of her hemorrhoidal disease.  All risks, benefits and alternative forms of therapy were described and informed consent was obtained.   The anorectum was pre-medicated with topical lidocaine (5%) and nitroglycerin (0.125%) The decision was made to band the right posterior internal hemorrhoid, and the [facility]an System was used to perform band ligation without complication.  Digital anorectal examination was then performed to assure proper positioning of the band, and to adjust the banded tissue as required.  The patient was discharged home without pain or other issues.  Dietary and behavioral recommendations were given and along with follow-up instructions.     The following adjunctive treatments were recommended:  Fiber supplementation Adequate water intake Avoidance of straining and hard stools  The patient will return in 2-4 weeks for  follow-up and possible additional banding as required. No complications were encountered and the patient tolerated the procedure well.

## 2022-07-29 NOTE — Patient Instructions (Signed)
_______________________________________________________  If you are age 33 or older, your body mass index should be between 23-30. Your Body mass index is 21.13 kg/m. If this is out of the aforementioned range listed, please consider follow up with your Primary Care Provider.  If you are age 34 or younger, your body mass index should be between 19-25. Your Body mass index is 21.13 kg/m. If this is out of the aformentioned range listed, please consider follow up with your Primary Care Provider.   HEMORRHOID BANDING PROCEDURE    FOLLOW-UP CARE   The procedure you have had should have been relatively painless since the banding of the area involved does not have nerve endings and there is no pain sensation.  The rubber band cuts off the blood supply to the hemorrhoid and the band may fall off as soon as 48 hours after the banding (the band may occasionally be seen in the toilet bowl following a bowel movement). You may notice a temporary feeling of fullness in the rectum which should respond adequately to plain Tylenol or Motrin.  Following the banding, avoid strenuous exercise that evening and resume full activity the next day.  A sitz bath (soaking in a warm tub) or bidet is soothing, and can be useful for cleansing the area after bowel movements.     To avoid constipation, take two tablespoons of natural wheat bran, natural oat bran, flax, Benefiber or any over the counter fiber supplement and increase your water intake to 7-8 glasses daily.    Unless you have been prescribed anorectal medication, do not put anything inside your rectum for two weeks: No suppositories, enemas, fingers, etc.  Occasionally, you may have more bleeding than usual after the banding procedure.  This is often from the untreated hemorrhoids rather than the treated one.  Don't be concerned if there is a tablespoon or so of blood.  If there is more blood than this, lie flat with your bottom higher than your head and  apply an ice pack to the area. If the bleeding does not stop within a half an hour or if you feel faint, call our office at [phone] or go to the emergency room.  Problems are not common; however, if there is a substantial amount of bleeding, severe pain, chills, fever or difficulty passing urine (very rare) or other problems, you should call us at [phone] or report to the nearest emergency room.  Do not stay seated continuously for more than 2-3 hours for a day or two after the procedure.  Tighten your buttock muscles 10-15 times every two hours and take 10-15 deep breaths every 1-2 hours.  Do not spend more than a few minutes on the toilet if you cannot empty your bowel; instead re-visit the toilet at a later time.    The [place] [facility] providers would like to encourage you to use [facility] to communicate with providers for non-urgent requests or questions.  Due to long hold times on the telephone, sending your provider a message by [facility] may be a faster and more efficient way to get a response.  Please allow 48 business hours for a response.  Please remember that this is for non-urgent requests.   It was a pleasure to see you today!  Thank you for trusting me with your gastrointestinal care!    Scott E.Tomasa Rand, MD

## 2022-08-27 ENCOUNTER — Telehealth: Payer: Self-pay | Admitting: Physician Assistant

## 2022-08-27 NOTE — Telephone Encounter (Signed)
Patient called this morning, after onset of some rectal bleeding this morning.  She is known to Dr. Tomasa Rand and had undergone 2 prior hemorrhoidal banding sessions, the last was about 3-1/2 weeks ago.  She did not have any bleeding or difficulty post banding, has 1 residual column of internal hemorrhoids.  She got up this morning to urinate and then noticed bright red blood with small clots on the tissue and since then has passed small amounts of bright red blood and small clots in the commode on 2 occasions, she says her abdomen feels somewhat full and uncomfortable but she has been constipated earlier in the week, had several good bowel movements a couple of days ago.  Advised that this is very likely hemorrhoidal bleeding, other possibility is post banding ulcer though she is 3 Weeks out from last procedure.  Patient advised to monitor her symptoms over the next several hours if she passes increased volume of blood or clots, then may need to come to [facility] or [facility] emergency room for evaluation.

## 2022-09-02 ENCOUNTER — Other Ambulatory Visit: Payer: Self-pay

## 2022-09-02 ENCOUNTER — Observation Stay (HOSPITAL_COMMUNITY)
Admission: EM | Admit: 2022-09-02 | Discharge: 2022-09-03 | Disposition: A | Discharge status: Home / Self Care | Payer: Medicaid Other | Attending: Internal Medicine | Admitting: Internal Medicine

## 2022-09-02 ENCOUNTER — Telehealth: Payer: Self-pay | Admitting: Gastroenterology

## 2022-09-02 ENCOUNTER — Encounter (HOSPITAL_COMMUNITY): Payer: Self-pay

## 2022-09-02 DIAGNOSIS — F419 Anxiety disorder, unspecified: Secondary | ICD-10-CM | POA: Diagnosis present

## 2022-09-02 DIAGNOSIS — Z87891 Personal history of nicotine dependence: Secondary | ICD-10-CM | POA: Insufficient documentation

## 2022-09-02 DIAGNOSIS — F900 Attention-deficit hyperactivity disorder, predominantly inattentive type: Secondary | ICD-10-CM | POA: Diagnosis present

## 2022-09-02 DIAGNOSIS — K625 Hemorrhage of anus and rectum: Principal | ICD-10-CM | POA: Diagnosis present

## 2022-09-02 DIAGNOSIS — R519 Headache, unspecified: Secondary | ICD-10-CM | POA: Diagnosis present

## 2022-09-02 DIAGNOSIS — Z79899 Other long term (current) drug therapy: Secondary | ICD-10-CM | POA: Insufficient documentation

## 2022-09-02 DIAGNOSIS — F17200 Nicotine dependence, unspecified, uncomplicated: Secondary | ICD-10-CM | POA: Diagnosis present

## 2022-09-02 DIAGNOSIS — Z87738 Personal history of other specified (corrected) congenital malformations of digestive system: Secondary | ICD-10-CM | POA: Diagnosis not present

## 2022-09-02 LAB — CBC WITH DIFFERENTIAL/PLATELET
Abs Immature Granulocytes: 0.01 10*3/uL (ref 0.00–0.07)
Basophils Absolute: 0 10*3/uL (ref 0.0–0.1)
Basophils Relative: 0 %
Eosinophils Absolute: 0 10*3/uL (ref 0.0–0.5)
Eosinophils Relative: 1 %
HCT: 39.5 % (ref 36.0–46.0)
Hemoglobin: 13.7 g/dL (ref 12.0–15.0)
Immature Granulocytes: 0 %
Lymphocytes Relative: 30 %
Lymphs Abs: 2 10*3/uL (ref 0.7–4.0)
MCH: 31.2 pg (ref 26.0–34.0)
MCHC: 34.7 g/dL (ref 30.0–36.0)
MCV: 90 fL (ref 80.0–100.0)
Monocytes Absolute: 0.4 10*3/uL (ref 0.1–1.0)
Monocytes Relative: 6 %
Neutro Abs: 4.2 10*3/uL (ref 1.7–7.7)
Neutrophils Relative %: 63 %
Platelets: 262 10*3/uL (ref 150–400)
RBC: 4.39 MIL/uL (ref 3.87–5.11)
RDW: 12 % (ref 11.5–15.5)
WBC: 6.6 10*3/uL (ref 4.0–10.5)
nRBC: 0 % (ref 0.0–0.2)

## 2022-09-02 LAB — URINALYSIS, ROUTINE W REFLEX MICROSCOPIC
Bacteria, UA: NONE SEEN
Bilirubin Urine: NEGATIVE
Glucose, UA: NEGATIVE mg/dL
Ketones, ur: NEGATIVE mg/dL
Nitrite: NEGATIVE
Protein, ur: NEGATIVE mg/dL
Specific Gravity, Urine: 1.017 (ref 1.005–1.030)
pH: 6 (ref 5.0–8.0)

## 2022-09-02 LAB — COMPREHENSIVE METABOLIC PANEL
ALT: 13 U/L (ref 0–44)
AST: 14 U/L — ABNORMAL LOW (ref 15–41)
Albumin: 3.9 g/dL (ref 3.5–5.0)
Alkaline Phosphatase: 65 U/L (ref 38–126)
Anion gap: 6 (ref 5–15)
BUN: 10 mg/dL (ref 6–20)
CO2: 27 mmol/L (ref 22–32)
Calcium: 9.3 mg/dL (ref 8.9–10.3)
Chloride: 107 mmol/L (ref 98–111)
Creatinine, Ser: 0.7 mg/dL (ref 0.44–1.00)
GFR, Estimated: >60 mL/min (ref >60)
Glucose, Bld: 96 mg/dL (ref 70–99)
Potassium: 3.5 mmol/L (ref 3.5–5.1)
Sodium: 140 mmol/L (ref 135–145)
Total Bilirubin: 0.4 mg/dL (ref 0.3–1.2)
Total Protein: 7.1 g/dL (ref 6.5–8.1)

## 2022-09-02 LAB — TSH: TSH: 1.913 u[IU]/mL (ref 0.350–4.500)

## 2022-09-02 LAB — LIPASE, BLOOD: Lipase: 35 U/L (ref 11–51)

## 2022-09-02 LAB — PROTIME-INR
INR: 1.1 (ref 0.8–1.2)
Prothrombin Time: 13.6 seconds (ref 11.4–15.2)

## 2022-09-02 LAB — TYPE AND SCREEN
ABO/RH(D): O POS
Antibody Screen: NEGATIVE

## 2022-09-02 LAB — HEMOGLOBIN AND HEMATOCRIT, BLOOD
HCT: 39.4 % (ref 36.0–46.0)
HCT: 40.8 % (ref 36.0–46.0)
Hemoglobin: 13.4 g/dL (ref 12.0–15.0)
Hemoglobin: 13.6 g/dL (ref 12.0–15.0)

## 2022-09-02 LAB — SEDIMENTATION RATE: Sed Rate: 2 mm/hr (ref 0–22)

## 2022-09-02 LAB — PREGNANCY, URINE: Preg Test, Ur: NEGATIVE

## 2022-09-02 LAB — GLUCOSE, CAPILLARY: Glucose-Capillary: 103 mg/dL — ABNORMAL HIGH (ref 70–99)

## 2022-09-02 MED ORDER — POLYETHYLENE GLYCOL 3350 17 GM/SCOOP PO POWD
0.5000 | Freq: Once | ORAL | Status: AC
  Administered 2022-09-02: 127.5 g via ORAL
  Filled 2022-09-02: qty 255

## 2022-09-02 MED ORDER — ACETAMINOPHEN 650 MG RE SUPP: 650.0000 mg | Freq: Four times a day (QID) | RECTAL | Status: DC | PRN

## 2022-09-02 MED ORDER — SODIUM CHLORIDE 0.9 % IV SOLN: INTRAVENOUS | Status: DC

## 2022-09-02 MED ORDER — ACETAMINOPHEN 325 MG PO TABS
650.0000 mg | ORAL_TABLET | Freq: Four times a day (QID) | ORAL | Status: DC | PRN
  Administered 2022-09-02: 650 mg via ORAL
  Filled 2022-09-02: qty 2

## 2022-09-02 MED ORDER — HYDROXYZINE HCL 10 MG PO TABS
10.0000 mg | ORAL_TABLET | Freq: Two times a day (BID) | ORAL | Status: DC
  Administered 2022-09-02 – 2022-09-03 (×2): 10 mg via ORAL
  Filled 2022-09-02 (×3): qty 1

## 2022-09-02 MED ORDER — ONDANSETRON HCL 4 MG/2ML IJ SOLN: 4.0000 mg | Freq: Four times a day (QID) | INTRAMUSCULAR | Status: DC | PRN

## 2022-09-02 MED ORDER — ONDANSETRON HCL 4 MG PO TABS: 4.0000 mg | ORAL_TABLET | Freq: Four times a day (QID) | ORAL | Status: DC | PRN

## 2022-09-02 NOTE — Telephone Encounter (Signed)
Patient called in stating she spoke with Amy, PA over the weekend regarding rectal bleeding & abdominal discomfort, and her symptoms have continued to worsen. Yesterday she had two large episodes of bright red rectal bleeding & passed a very large clot that continued to bleed over 30 minutes. Today the bleeding has continued. Her lower abdomen feels very full & she experiences stabbing pains with bowel movements. Patient was very hesitant to go to ED given that she has been on two other occasions ([facility]) and no further testing was done. Advised that based on her symptoms worsening & previous recommendations that she been seen at [facility] or [facility] where our providers are on site. Patient plans to go to [facility]. Will make our [place], [place] aware since she is at [facility] today.

## 2022-09-02 NOTE — Assessment & Plan Note (Signed)
Continue hydroxyzine BID

## 2022-09-02 NOTE — ED Triage Notes (Addendum)
Patient said she was sent from her GI doctor due to her rectal bleeding. She has been bleeding since Saturday. Bright red in color with clots. Feeling cold, dizzy, light headed.

## 2022-09-02 NOTE — ED Provider Triage Note (Signed)
Emergency Medicine Provider Triage Evaluation Note  [name] , a 31 y.o. female  was evaluated in triage.  Pt complains of rectal bleeding.  Patient reports she had a band ligation done at the end of November and another one scheduled in January.  However she is still passing bright red blood.  Yesterday she noticed a big blood clot in her stools.  Patient reports she has been feeling very weak and nauseous.  No fever, chest pain, shortness of breath, urinary symptoms.  Review of Systems  Positive: As above Negative: As above  Physical Exam  BP 109/78 (BP Location: Left Arm)   Pulse 80   Temp 98.6 F (37 C) (Oral)   Resp 19   SpO2 100%  Gen:   Awake, no distress   Resp:  Normal effort  MSK:   Moves extremities without difficulty  Other:  Tenderness to palpation to lower abdomen.  Medical Decision Making  Medically screening exam initiated at 1:44 PM.  Appropriate orders placed.  [name] was informed that the remainder of the evaluation will be completed by another provider, this initial triage assessment does not replace that evaluation, and the importance of remaining in the ED until their evaluation is complete.     [name], [place] 09/02/22 1347

## 2022-09-02 NOTE — Assessment & Plan Note (Addendum)
31 year old presenting with week long history of rectal bleeding s/p hemorrhoidal banding on 07/29/22.  GI was consulted. Flex sigmoidoscopy this AM. See op note. No active bleeding seen. No anemia, Hbg stable and within normal. GI recommends: --Use Benefiber one teaspoon PO daily with 8 ounces of water. -- ProctoFoam-HC: Apply externally BID for 10 days. -- Sitz bath's twice daily -- Reasonable to pursue another attempt at hemorrhoidal banding. She already has follow-up appointment in coming days. -- If still with problems, would recommend surgical evaluation for possible EUA/hemorrhoidectomy.

## 2022-09-02 NOTE — ED Notes (Signed)
Pt states she wants to wait to do the enema when she is in a room upstairs

## 2022-09-02 NOTE — Assessment & Plan Note (Addendum)
Resume Adderall at discharge.

## 2022-09-02 NOTE — Consult Note (Addendum)
Referring Provider: No ref. provider found Primary Care Physician:  Nelwyn Salisbury, MD Primary Gastroenterologist:  Dr. Tomasa Rand  Reason for Consultation:  LBGI  HPI: [name] is a 31 y.o. female with past medical history of ADHD, anxiety, depression, chronic headaches, history of kidney stones, GERD.  He had a colonoscopy by our practice earlier this year with only hemorrhoids noted.  She underwent hemorrhoid banding with Dr. Tomasa Rand on 07/29/2022.  She called last weekend with complaints of rectal bleeding.  Our on-call PA suggested that she proceed to the ER if the bleeding continued or worsened.  She says that the bleeding has continued and she probably should come to the ER sooner, but it is Christmas time and she did not have care for her kids, etc.  She says that last night she passed a clot the size of her fist.  She said she would have come last night if she would have had childcare available.  The last that she has passed any blood was upon awakening this morning.  She describes discomfort in the suprapubic region.  She denies any other abdominal pain, no nausea or vomiting, no fevers or chills.  Her labs including CBC, lipase, and CMP are completely normal.  Her fianc was on the phone during our interview.  Past Medical History:  Diagnosis Date   ADHD (attention deficit hyperactivity disorder), inattentive type    Anemia    with pregnancy   Anxiety    Chronic headaches    began late 2012   Depression    GERD (gastroesophageal reflux disease)    History of kidney stones    x 4, first time age 22yo   Previous cesarean delivery, antepartum condition or complication 04/01/2011   Seasonal allergic rhinitis     Past Surgical History:  Procedure Laterality Date   CESAREAN SECTION     CHOLECYSTECTOMY, LAPAROSCOPIC  2021   COLONOSCOPY  07/20/2017   per Dr. Janna Arch at [facility], single benign polyp, repeat in 5 yrs (mother had colon cancer)   COLONOSCOPY   03/17/2022   TONSILLECTOMY     TUBAL LIGATION Bilateral 08/14/2015   Procedure: POST PARTUM TUBAL LIGATION;  Surgeon: Geryl Rankins, MD;  Location: [facility];  Service: Gynecology;  Laterality: Bilateral;    Prior to Admission medications   Medication Sig Start Date End Date Taking? Authorizing Provider  amphetamine-dextroamphetamine (ADDERALL) 10 MG tablet Take 1 tablet (10 mg total) by mouth every evening. 08/07/22 09/06/22  Nelwyn Salisbury, MD  amphetamine-dextroamphetamine (ADDERALL) 15 MG tablet Take 1 tablet by mouth in the morning. 08/07/22 09/06/22  Nelwyn Salisbury, MD  dicyclomine (BENTYL) 20 MG tablet Take 1 tablet (20 mg total) by mouth every 6 (six) hours. Patient not taking: Reported on 07/29/2022 03/04/22   Jenel Lucks, MD  docusate sodium (COLACE) 100 MG capsule Take 1 capsule (100 mg total) by mouth 2 (two) times daily. 12/10/21   Nelwyn Salisbury, MD  hydrOXYzine (ATARAX) 10 MG tablet Take 1 tablet (10 mg total) by mouth in the morning and at bedtime. 12/10/21   Nelwyn Salisbury, MD    No current facility-administered medications for this encounter.   Current Outpatient Medications  Medication Sig Dispense Refill   amphetamine-dextroamphetamine (ADDERALL) 10 MG tablet Take 1 tablet (10 mg total) by mouth every evening. 30 tablet 0   amphetamine-dextroamphetamine (ADDERALL) 15 MG tablet Take 1 tablet by mouth in the morning. 30 tablet 0   dicyclomine (BENTYL) 20  MG tablet Take 1 tablet (20 mg total) by mouth every 6 (six) hours. (Patient not taking: Reported on 07/29/2022) 30 tablet 1   docusate sodium (COLACE) 100 MG capsule Take 1 capsule (100 mg total) by mouth 2 (two) times daily. 180 capsule 3   hydrOXYzine (ATARAX) 10 MG tablet Take 1 tablet (10 mg total) by mouth in the morning and at bedtime. 180 tablet 3    Allergies as of 09/02/2022   (No Known Allergies)    Family History  Problem Relation Age of Onset   Colon cancer Mother    Hypertension Mother    Cancer  Mother 71       colon   Diabetes Mother        borderline   Thyroid disease Mother    Depression Mother    Anxiety disorder Mother    Other Father        his health hx unknown   ADD / ADHD Sister    Heart disease Maternal Uncle    Diabetes Maternal Grandmother    Hypertension Maternal Grandfather    Heart disease Maternal Grandfather    Brain cancer Paternal Grandmother        Ruhama thinks   Heart murmur Daughter    Cancer Other        others on maternal side   Stroke Neg Hx     Social History   Socioeconomic History   Marital status: Single    Spouse name: Not on file   Number of children: 3   Years of education: Not on file   Highest education level: Not on file  Occupational History   Occupation: self employed- Education officer, environmental business  Tobacco Use   Smoking status: Former    Years: 7.00    Types: Cigarettes    Passive exposure: Past   Smokeless tobacco: Never   Tobacco comments:    4-5 cigarettes per day  Vaping Use   Vaping Use: Every day  Substance and Sexual Activity   Alcohol use: No    Alcohol/week: 0.0 standard drinks of alcohol   Drug use: No   Sexual activity: Not Currently    Birth control/protection: Surgical    Comment: tubal ligation  Other Topics Concern   Not on file  Social History Narrative   Single, 2 children, ages 47yo and 39mo, exercise - running after her children   Social Determinants of Health   Financial Resource Strain: Not on file  Food Insecurity: Not on file  Transportation Needs: Not on file  Physical Activity: Not on file  Stress: Not on file  Social Connections: Not on file  Intimate Partner Violence: Not on file    Review of Systems: ROS is O/W negative except as mentioned in HPI.  Physical Exam: Vital signs in last 24 hours: Temp:  [98.5 F (36.9 C)-98.6 F (37 C)] 98.5 F (36.9 C) (12/22 1418) Pulse Rate:  [74-84] 80 (12/22 1530) Resp:  [16-19] 16 (12/22 1530) BP: (109-120)/(69-81) 120/69 (12/22 1530) SpO2:  [96  %-100 %] 100 % (12/22 1530)   General:  Alert, Well-developed, well-nourished, pleasant and cooperative in NAD; tearful and frustrated. Head:  Normocephalic and atraumatic. Eyes:  Sclera clear, no icterus.   Conjunctiva pink. Ears:  Normal auditory acuity. Mouth:  No deformity or lesions.   Lungs:  Clear throughout to auscultation.   No wheezes, crackles, or rhonchi.  Heart:  Regular rate and rhythm; no murmurs, clicks, rubs, or gallops. Abdomen:  Soft, non-distended.  BS present.  Some suprapubic TTP.   Msk:  Symmetrical without gross deformities. Pulses:  Normal pulses noted. Extremities:  Without clubbing or edema. Neurologic:  Alert and oriented x 4;  grossly normal neurologically. Skin:  Intact without significant lesions or rashes. Psych:  Alert and cooperative. Normal mood and affect.  Lab Results: Recent Labs    09/02/22 1430  WBC 6.6  HGB 13.7  HCT 39.5  PLT 262   BMET Recent Labs    09/02/22 1430  NA 140  K 3.5  CL 107  CO2 27  GLUCOSE 96  BUN 10  CREATININE 0.70  CALCIUM 9.3   LFT Recent Labs    09/02/22 1430  PROT 7.1  ALBUMIN 3.9  AST 14*  ALT 13  ALKPHOS 65  BILITOT 0.4   IMPRESSION:  *Rectal bleeding: Has history of internal hemorrhoids seen on colonoscopy earlier this year.  Had hemorrhoid banding on 07/29/2022.  Describes a large amount of bleeding over the past 5 to 6 days.  Last night says she passed a clot the size of her fist.  Last that she passed blood was this morning upon waking up.  She is very nervous, tearful, frustrated and feels like she is being dismissed.  All of her labs are normal.  Possible ulcer related to post hemorrhoid banding although it is somewhat far out for that in regards to timing.  Sounds like a lot of bleeding and prolonged bleeding for just a hemorrhoid itself.  PLAN: -The ER doctor is going to see if the hospitalist will admit her for observation.  We will plan for flexible sigmoidoscopy with any intervention  needed for tomorrow morning tentatively at 10:15 AM with Dr. Chales Abrahams.  Trend labs/hemoglobin.  Princella Pellegrini. Zehr  09/02/2022, 3:45 PM

## 2022-09-02 NOTE — H&P (Signed)
History and Physical    Patient: [name] [name] DOB: 1990-11-27 DOA: 09/02/2022 DOS: the patient was seen and examined on 09/02/2022 PCP: Nelwyn Salisbury, MD  Patient coming from: Home - lives with her kids.    Chief Complaint: rectal bleeding   HPI: [name] is a 31 y.o. female with medical history significant of ADHD, anxiety, chronic headaches, GERD, hx of kidney stones who presented to ED with complaints of rectal bleeding since last weekend.  This was the first time she had bleeding in quite a while after her banding.  She called the GI office and they told her to come to ER if the bleeding continued, but she didn't come despite it bleeding.  Yesterday her stomach was hurting her and when she went to have a BM she passed a large blood clot. She has continued to bleed.  She underwent hemorrhoid banding by Dr. Tomasa Rand on 07/29/2022.  Colonoscopy wnl except for hemorrhoids on July 6,2023.   She has been light headed and dizzy and cold. Her kids have been sick as well. She had fever to 100.3, but none since then.   No vision changes/headaches, chest pain or palpitations, + shortness of breath or cough, dysuria or leg swelling. She has chronic nausea, this is unchanged.   She vapes and does not drink alcohol.   ER Course:  vitals: afebrile, bp: 109/78, HR; 80, RR: 19, oxygen: 100%RA Pertinent labs: hgb/hct: wnl In ED: GI consulted. Given miralax and tap water enema. GI recommended observation.    Review of Systems: As mentioned in the history of present illness. All other systems reviewed and are negative. Past Medical History:  Diagnosis Date   ADHD (attention deficit hyperactivity disorder), inattentive type    Anemia    with pregnancy   Anxiety    Chronic headaches    began late 2012   Depression    GERD (gastroesophageal reflux disease)    History of kidney stones    x 4, first time age 69yo   Previous cesarean delivery, antepartum condition or  complication 04/01/2011   Seasonal allergic rhinitis    Past Surgical History:  Procedure Laterality Date   CESAREAN SECTION     CHOLECYSTECTOMY, LAPAROSCOPIC  2021   COLONOSCOPY  07/20/2017   per Dr. Janna Arch at [facility], single benign polyp, repeat in 5 yrs (mother had colon cancer)   COLONOSCOPY  03/17/2022   TONSILLECTOMY     TUBAL LIGATION Bilateral 08/14/2015   Procedure: POST PARTUM TUBAL LIGATION;  Surgeon: Geryl Rankins, MD;  Location: [facility];  Service: Gynecology;  Laterality: Bilateral;   Social History:  reports that she has quit smoking. Her smoking use included cigarettes. She has been exposed to tobacco smoke. She has never used smokeless tobacco. She reports that she does not drink alcohol and does not use drugs.  No Known Allergies  Family History  Problem Relation Age of Onset   Colon cancer Mother    Hypertension Mother    Cancer Mother 50       colon   Diabetes Mother        borderline   Thyroid disease Mother    Depression Mother    Anxiety disorder Mother    Other Father        his health hx unknown   ADD / ADHD Sister    Heart disease Maternal Uncle    Diabetes Maternal Grandmother    Hypertension Maternal Grandfather    Heart  disease Maternal Grandfather    Brain cancer Paternal Grandmother        Tacia thinks   Heart murmur Daughter    Cancer Other        others on maternal side   Stroke Neg Hx     Prior to Admission medications   Medication Sig Start Date End Date Taking? Authorizing Provider  amphetamine-dextroamphetamine (ADDERALL) 10 MG tablet Take 1 tablet (10 mg total) by mouth every evening. Patient taking differently: Take 10 mg by mouth See admin instructions. Take 10 mg by mouth in the evening as needed/as directed by provider 08/07/22 09/06/22 Yes Nelwyn Salisbury, MD  amphetamine-dextroamphetamine (ADDERALL) 15 MG tablet Take 1 tablet by mouth in the morning. 08/07/22 09/06/22 Yes Nelwyn Salisbury, MD  docusate sodium (COLACE)  100 MG capsule Take 1 capsule (100 mg total) by mouth 2 (two) times daily. Patient taking differently: Take 100 mg by mouth in the morning and at bedtime. 12/10/21  Yes Nelwyn Salisbury, MD  hydrOXYzine (ATARAX) 10 MG tablet Take 1 tablet (10 mg total) by mouth in the morning and at bedtime. 12/10/21  Yes Nelwyn Salisbury, MD  ibuprofen (ADVIL) 200 MG tablet Take 400 mg by mouth every 6 (six) hours as needed for fever, mild pain or headache.   Yes [provider]  dicyclomine (BENTYL) 20 MG tablet Take 1 tablet (20 mg total) by mouth every 6 (six) hours. Patient not taking: Reported on 07/29/2022 03/04/22   Jenel Lucks, MD    Physical Exam: Vitals:   09/02/22 1715 09/02/22 1730 09/02/22 1745 09/02/22 1817  BP: 116/84 117/77 100/71   Pulse: 80 80 71   Resp: 16  16   Temp:    98.2 F (36.8 C)  TempSrc:    Oral  SpO2: 99% 97% 99%    General:  Appears calm and comfortable and is in NAD. Thin.  Eyes:  PERRL, EOMI, normal lids, iris ENT:  grossly normal hearing, lips & tongue, mmm; appropriate dentition Neck:  no LAD, masses or thyromegaly; no carotid bruits Cardiovascular:  RRR, no m/r/g. No LE edema.  Respiratory:   CTA bilaterally with no wheezes/rales/rhonchi.  Normal respiratory effort. Abdomen:  soft, TTP in lower quadrants, ND, NABS Back:   normal alignment, no CVAT Skin:  no rash or induration seen on limited exam Musculoskeletal:  grossly normal tone BUE/BLE, good ROM, no bony abnormality Lower extremity:  No LE edema.  Limited foot exam with no ulcerations.  2+ distal pulses. Psychiatric:  grossly normal mood and affect, speech fluent and appropriate, AOx3 Neurologic:  CN 2-12 grossly intact, moves all extremities in coordinated fashion, sensation intact   Radiological Exams on Admission: Independently reviewed - see discussion in A/P where applicable  No results found.    Labs on Admission: I have personally reviewed the available labs and imaging studies at  the time of the admission.  Pertinent labs:   None   Assessment and Plan: Principal Problem:   Rectal bleeding Active Problems:   Anxiety   ADHD (attention deficit hyperactivity disorder), inattentive type   Tobacco use disorder    Assessment and Plan: * Rectal bleeding 31 year old presenting with week long history of rectal bleeding s/p hemorrhoidal banding on 07/29/22 -obs to progressive -GI consulted with plans for flex sig tomorrow, ? Ulcer -CBC q 6 hours, hgb/hct stable -type and screen, transfuse if hgb <7 -clear liquids, NPO at midnight  -normal colonoscopy 03/2022 except for hemorrhoids.  -  check inflammatory markers/TSH -gentle IVF -if continues to bleed or any worsening stomach pain would CT her abdomen   Anxiety Continue hydroxyzine BID  ADHD (attention deficit hyperactivity disorder), inattentive type Hold adderall while in hospital   Tobacco use disorder Vaping history, declines nicotine patch  Check UDS    Advance Care Planning:   Code Status: Full Code discussed with patient   Consults: GI: [place]  DVT Prophylaxis: SCDs  Family Communication: mother, grandmother and fiance at bedside.   Severity of Illness: The appropriate patient status for this patient is OBSERVATION. Observation status is judged to be reasonable and necessary in order to provide the required intensity of service to ensure the patient's safety. The patient's presenting symptoms, physical exam findings, and initial radiographic and laboratory data in the context of their medical condition is felt to place them at decreased risk for further clinical deterioration. Furthermore, it is anticipated that the patient will be medically stable for discharge from the hospital within 2 midnights of admission.   Author: Orland Mustard, MD 09/02/2022 7:02 PM  For on call review [email or web address].

## 2022-09-02 NOTE — ED Notes (Signed)
Pt states she wants to wait for the enema until after she eats

## 2022-09-02 NOTE — Telephone Encounter (Signed)
Patient called.

## 2022-09-02 NOTE — Telephone Encounter (Signed)
error 

## 2022-09-02 NOTE — Telephone Encounter (Signed)
Agree with ED since this is concerning for post-banding ulcer bleed.  - HD

## 2022-09-02 NOTE — ED Provider Notes (Signed)
[facility]-EMERGENCY DEPT Provider Note   CSN: [id number] Arrival date & time: 09/02/22  1317     History  Chief Complaint  Patient presents with   Rectal Bleeding    [name] is a 31 y.o. female.  HPI Has history of repeat hemorrhoidal banding by gastroenterology Dr. Marijean Heath.  She had had initial band ligation of the lateral left hemorrhoidal column on October 9, repeat procedure done 11\17.  Patient reports that the procedure has been weeks ago but she started bleeding today.  She reports that she awakened in the morning and passed a very large clot about fist size.  Then she had bright red blood dripping in the toilet.  Reports she has had some pain in the suprapubic area.  She denies she has been constipated recently she reports she is taking her medications and not having difficulty passing stool.  No fevers no chills no vomiting.  She reports she is compliant with her Colace and does not strain at stool.    Home Medications Prior to Admission medications   Medication Sig Start Date End Date Taking? Authorizing Provider  amphetamine-dextroamphetamine (ADDERALL) 10 MG tablet Take 1 tablet (10 mg total) by mouth every evening. 08/07/22 09/06/22  Nelwyn Salisbury, MD  amphetamine-dextroamphetamine (ADDERALL) 15 MG tablet Take 1 tablet by mouth in the morning. 08/07/22 09/06/22  Nelwyn Salisbury, MD  dicyclomine (BENTYL) 20 MG tablet Take 1 tablet (20 mg total) by mouth every 6 (six) hours. Patient not taking: Reported on 07/29/2022 03/04/22   Jenel Lucks, MD  docusate sodium (COLACE) 100 MG capsule Take 1 capsule (100 mg total) by mouth 2 (two) times daily. 12/10/21   Nelwyn Salisbury, MD  hydrOXYzine (ATARAX) 10 MG tablet Take 1 tablet (10 mg total) by mouth in the morning and at bedtime. 12/10/21   Nelwyn Salisbury, MD      Allergies    Patient has no known allergies.    Review of Systems   Review of Systems  Physical Exam Updated  Vital Signs BP 120/69   Pulse 80   Temp 98.5 F (36.9 C) (Oral)   Resp 16   SpO2 100%  Physical Exam Constitutional:      Comments: Alert nontoxic well in appearance.  Color is good.  Mental status clear.  HENT:     Mouth/Throat:     Pharynx: Oropharynx is clear.  Eyes:     Extraocular Movements: Extraocular movements intact.  Cardiovascular:     Rate and Rhythm: Normal rate and regular rhythm.  Pulmonary:     Effort: Pulmonary effort is normal.     Breath sounds: Normal breath sounds.  Abdominal:     Comments: Abdomen soft.  No guarding.  Moderate suprapubic discomfort to deep palpation.  No palpable mass.  Genitourinary:    Comments: Patient has mild to moderate chronic appearing perianal tissue prolapse that is noninflamed at this time.  There are no thrombosed hemorrhoids present.  Perianal tissue is healthy in appearance.  There is no erythema or drainage or discharge.  No blood present.  Anal sphincter is tight for passage of digit for internal exam.  Once passed, there is small amount of formed stool in the vault.  Stool is brown.  There is no visible blood or clot after digital exam. Musculoskeletal:        General: No swelling or tenderness. Normal range of motion.     Right lower leg: No edema.  Left lower leg: No edema.  Skin:    General: Skin is warm and dry.  Neurological:     General: No focal deficit present.     Mental Status: She is oriented to person, place, and time.     Motor: No weakness.  Psychiatric:        Mood and Affect: Mood normal.     ED Results / Procedures / Treatments   Labs (all labs ordered are listed, but only abnormal results are displayed) Labs Reviewed  COMPREHENSIVE METABOLIC PANEL - Abnormal; Notable for the following components:      Result Value   AST 14 (*)    All other components within normal limits  URINALYSIS, ROUTINE W REFLEX MICROSCOPIC - Abnormal; Notable for the following components:   APPearance HAZY (*)    Hgb  urine dipstick LARGE (*)    Leukocytes,Ua TRACE (*)    All other components within normal limits  CBC WITH DIFFERENTIAL/PLATELET  LIPASE, BLOOD  PREGNANCY, URINE    EKG None  Radiology No results found.  Procedures Procedures    Medications Ordered in ED Medications - No data to display  ED Course/ Medical Decision Making/ A&P                           Medical Decision Making Risk Decision regarding hospitalization.   EMR review for additional history and recent management.  Patient has prior history of renal banding without much change in hemorrhoidal tissue.  She had repeat procedure 11\17.  Just in the past day she developed large amount of blood clot and passage of bright red blood.  At this time there is no further bleeding on exam.    Hemoglobin is 13.8.  Vital signs are stable.  This appears consistent with sporadic, heavy hemorrhoidal bleeding.  Patient had communicated with her gastroenterologist group and was advised to come to emergency department for assessment.  She will be seen by PA-C for [facility] in the emergency department for definitive management recommendations.  Patient assessed by [facility] PA-C Zehr. At this time, GI requests obs admit for possible post banding ulcer and flex sigmoidoscopy tomorrow.        Final Clinical Impression(s) / ED Diagnoses Final diagnoses:  Rectal bleed S/p hemorrhoid banding    Rx / DC Orders ED Discharge Orders     None         Arby Barrette, MD 09/02/22 1625

## 2022-09-02 NOTE — Assessment & Plan Note (Signed)
Vaping history, declines nicotine patch  Check UDS

## 2022-09-02 NOTE — Telephone Encounter (Signed)
Patient called stated after she called after hours last week, said symptoms had improved slightly so she did not feel the need to go to the ED at that time. However, this morning she passed a blood clot the size of her first through her rectum and has not stopped leaking since. Advised patient of Amy's recommendation if symptoms worsened to go to the ED. Patient would like to be seen by Dr.Cunningham. Please advise.

## 2022-09-03 ENCOUNTER — Encounter (HOSPITAL_COMMUNITY): Payer: Self-pay | Admitting: Family Medicine

## 2022-09-03 ENCOUNTER — Observation Stay (HOSPITAL_BASED_OUTPATIENT_CLINIC_OR_DEPARTMENT_OTHER): Payer: Medicaid Other | Admitting: Anesthesiology

## 2022-09-03 ENCOUNTER — Encounter (HOSPITAL_COMMUNITY)
Admission: EM | Disposition: A | Discharge status: Home / Self Care | Payer: Self-pay | Source: Home / Self Care | Attending: Emergency Medicine

## 2022-09-03 ENCOUNTER — Observation Stay (HOSPITAL_COMMUNITY): Payer: Medicaid Other | Admitting: Anesthesiology

## 2022-09-03 DIAGNOSIS — K625 Hemorrhage of anus and rectum: Secondary | ICD-10-CM | POA: Diagnosis not present

## 2022-09-03 DIAGNOSIS — Z87891 Personal history of nicotine dependence: Secondary | ICD-10-CM

## 2022-09-03 DIAGNOSIS — K642 Third degree hemorrhoids: Secondary | ICD-10-CM | POA: Diagnosis not present

## 2022-09-03 DIAGNOSIS — F909 Attention-deficit hyperactivity disorder, unspecified type: Secondary | ICD-10-CM | POA: Diagnosis not present

## 2022-09-03 DIAGNOSIS — F418 Other specified anxiety disorders: Secondary | ICD-10-CM | POA: Diagnosis not present

## 2022-09-03 HISTORY — PX: COLONOSCOPY: SHX174

## 2022-09-03 HISTORY — PX: FLEXIBLE SIGMOIDOSCOPY: SHX5431

## 2022-09-03 LAB — RAPID URINE DRUG SCREEN, HOSP PERFORMED
Amphetamines: POSITIVE — AB
Barbiturates: NOT DETECTED
Benzodiazepines: NOT DETECTED
Cocaine: NOT DETECTED
Opiates: NOT DETECTED
Tetrahydrocannabinol: POSITIVE — AB

## 2022-09-03 LAB — BASIC METABOLIC PANEL
Anion gap: 5 (ref 5–15)
BUN: 14 mg/dL (ref 6–20)
CO2: 25 mmol/L (ref 22–32)
Calcium: 8.7 mg/dL — ABNORMAL LOW (ref 8.9–10.3)
Chloride: 110 mmol/L (ref 98–111)
Creatinine, Ser: 0.59 mg/dL (ref 0.44–1.00)
GFR, Estimated: >60 mL/min (ref >60)
Glucose, Bld: 97 mg/dL (ref 70–99)
Potassium: 3.6 mmol/L (ref 3.5–5.1)
Sodium: 140 mmol/L (ref 135–145)

## 2022-09-03 LAB — HEMOGLOBIN AND HEMATOCRIT, BLOOD
HCT: 38.9 % (ref 36.0–46.0)
Hemoglobin: 12.9 g/dL (ref 12.0–15.0)

## 2022-09-03 LAB — SURGICAL PCR SCREEN
MRSA, PCR: NEGATIVE
Staphylococcus aureus: POSITIVE — AB

## 2022-09-03 LAB — HIV ANTIBODY (ROUTINE TESTING W REFLEX): HIV Screen 4th Generation wRfx: NONREACTIVE

## 2022-09-03 LAB — C-REACTIVE PROTEIN: CRP: <0.5 mg/dL (ref <1.0)

## 2022-09-03 SURGERY — SIGMOIDOSCOPY, FLEXIBLE
Anesthesia: Monitor Anesthesia Care

## 2022-09-03 MED ORDER — DIPHENHYDRAMINE HCL 50 MG/ML IJ SOLN
12.5000 mg | Freq: Once | INTRAMUSCULAR | Status: AC
  Administered 2022-09-03: 12.5 mg via INTRAVENOUS
  Filled 2022-09-03: qty 1

## 2022-09-03 MED ORDER — ORAL CARE MOUTH RINSE: 15.0000 mL | OROMUCOSAL | Status: DC | PRN

## 2022-09-03 MED ORDER — MUPIROCIN 2 % EX OINT
1.0000 | TOPICAL_OINTMENT | Freq: Two times a day (BID) | CUTANEOUS | Status: DC
  Administered 2022-09-03 (×2): 1 via NASAL
  Filled 2022-09-03: qty 22

## 2022-09-03 MED ORDER — PROPOFOL 500 MG/50ML IV EMUL
INTRAVENOUS | Status: DC | PRN
  Administered 2022-09-03: 150 ug/kg/min via INTRAVENOUS

## 2022-09-03 MED ORDER — HYDROCORT-PRAMOXINE (PERIANAL) 1-1 % EX FOAM: 1.0000 | Freq: Two times a day (BID) | CUTANEOUS | 0 refills | Status: AC

## 2022-09-03 MED ORDER — KETOROLAC TROMETHAMINE 30 MG/ML IJ SOLN
15.0000 mg | Freq: Once | INTRAMUSCULAR | Status: AC
  Administered 2022-09-03: 15 mg via INTRAVENOUS
  Filled 2022-09-03: qty 1

## 2022-09-03 MED ORDER — LACTATED RINGERS IV SOLN: INTRAVENOUS | Status: DC | PRN

## 2022-09-03 MED ORDER — PROPOFOL 10 MG/ML IV BOLUS
INTRAVENOUS | Status: AC
  Filled 2022-09-03: qty 20

## 2022-09-03 MED ORDER — KETOROLAC TROMETHAMINE 15 MG/ML IJ SOLN
15.0000 mg | Freq: Once | INTRAMUSCULAR | Status: AC
  Administered 2022-09-03: 15 mg via INTRAVENOUS
  Filled 2022-09-03 (×2): qty 1

## 2022-09-03 MED ORDER — METOCLOPRAMIDE HCL 5 MG/ML IJ SOLN
10.0000 mg | Freq: Once | INTRAMUSCULAR | Status: AC
  Administered 2022-09-03: 10 mg via INTRAVENOUS
  Filled 2022-09-03: qty 2

## 2022-09-03 MED ORDER — PROPOFOL 500 MG/50ML IV EMUL
INTRAVENOUS | Status: AC
  Filled 2022-09-03: qty 50

## 2022-09-03 MED ORDER — HYDROCORT-PRAMOXINE (PERIANAL) 1-1 % EX FOAM
1.0000 | Freq: Two times a day (BID) | CUTANEOUS | Status: DC
  Administered 2022-09-03: 1 via RECTAL
  Filled 2022-09-03: qty 10

## 2022-09-03 MED ORDER — PROPOFOL 10 MG/ML IV BOLUS
INTRAVENOUS | Status: DC | PRN
  Administered 2022-09-03: 20 mg via INTRAVENOUS
  Administered 2022-09-03: 30 mg via INTRAVENOUS
  Administered 2022-09-03: 20 mg via INTRAVENOUS

## 2022-09-03 NOTE — Assessment & Plan Note (Signed)
Pt reported developing migraine this AM. Treated with cocktail - IC Toradol, Benadryl, Reglan. Improved.

## 2022-09-03 NOTE — Anesthesia Procedure Notes (Signed)
Procedure Name: MAC Date/Time: 09/03/2022 11:24 AM  Performed by: Niel Hummer, CRNAPre-anesthesia Checklist: Patient identified, Emergency Drugs available, Suction available and Patient being monitored Oxygen Delivery Method: Simple face mask

## 2022-09-03 NOTE — Discharge Summary (Signed)
Physician Discharge Summary   Patient: [name] MRN: [id number] DOB: 01-30-1991  Admit date:     09/02/2022  Discharge date: 09/03/22  Discharge Physician: Ezekiel Slocumb   PCP: Laurey Morale, MD   Recommendations at discharge:    Follow up with Gastroenterology as instructed Follow up with Primary Care in 1-2 weeks Repeat CBC, BMP in 1-2 weeks  Discharge Diagnoses: Principal Problem:   Rectal bleeding Active Problems:   Anxiety   ADHD (attention deficit hyperactivity disorder), inattentive type   Tobacco use disorder   Chronic headache  Resolved Problems:   * No resolved hospital problems. [facility] Course: HPI on admission, per Dr. Rogers Blocker: "[name] is a 31 y.o. female with medical history significant of ADHD, anxiety, chronic headaches, GERD, hx of kidney stones who presented to ED with complaints of rectal bleeding since last weekend.  This was the first time she had bleeding in quite a while after her banding.  She called the GI office and they told her to come to ER if the bleeding continued, but she didn't come despite it bleeding.  Yesterday her stomach was hurting her and when she went to have a BM she passed a large blood clot. She has continued to bleed.  She underwent hemorrhoid banding by Dr. Candis Schatz on 07/29/2022.  Colonoscopy wnl except for hemorrhoids on July 6,2023.    She has been light headed and dizzy and cold. Her kids have been sick as well. She had fever to 100.3, but none since then.    No vision changes/headaches, chest pain or palpitations, + shortness of breath or cough, dysuria or leg swelling. She has chronic nausea, this is unchanged.    She vapes and does not drink alcohol.    ER Course:  vitals: afebrile, bp: 109/78, HR; 80, RR: 19, oxygen: 100%RA Pertinent labs: hgb/hct: wnl In ED: GI consulted. Given miralax and tap water enema. GI recommended observation. "   Gastroenterology consulted and patient taken for flex  sigmoidoscopy today.  Operative findings as below: "Impression:                - Significant prolapsed internal hemorrhoids. No active bleeding.  - Well-healed hemorrhoidal banding scar without any bleeding.  - Minor degree of rectal prolapse.  - No specimens collected."   Patient did not have bleeding overnight or this morning. Hbg stable, no anemia.  Patient reported hx of migraines and developing headache this morning.  Treated with IV headache cocktail with improvement.  GI recommends discharge home. Recommendations: --Use Benefiber one teaspoon PO daily with 8 ounces of water. -- ProctoFoam-HC: Apply externally BID for 10 days. -- Sitz bath's twice daily -- Reasonable to pursue another attempt at hemorrhoidal banding. She already has follow-up appointment in coming days. -- If still with problems, would recommend surgical evaluation for possible EUA/hemorrhoidectomy.   Assessment and Plan: * Rectal bleeding 31 year old presenting with week long history of rectal bleeding s/p hemorrhoidal banding on 07/29/22.  GI was consulted. Flex sigmoidoscopy this AM. See op note. No active bleeding seen. No anemia, Hbg stable and within normal. GI recommends: --Use Benefiber one teaspoon PO daily with 8 ounces of water. -- ProctoFoam-HC: Apply externally BID for 10 days. -- Sitz bath's twice daily -- Reasonable to pursue another attempt at hemorrhoidal banding. She already has follow-up appointment in coming days. -- If still with problems, would recommend surgical evaluation for possible EUA/hemorrhoidectomy.   Anxiety Continue hydroxyzine BID  ADHD (attention deficit hyperactivity disorder), inattentive type Resume Adderall at discharge.  Tobacco use disorder Vaping history, declines nicotine patch    Chronic headache Pt reported developing migraine this AM. Treated with cocktail - IC Toradol, Benadryl, Reglan. Improved.         Consultants:  Gastroenterology Procedures performed: Flex sigmoidoscopy  Disposition: Home Diet recommendation:  Discharge Diet Orders (From admission, onward)     Start     Ordered   09/03/22 0000  Diet - low sodium heart healthy        09/03/22 1436            DISCHARGE MEDICATION: Allergies as of 09/03/2022   No Known Allergies      Medication List     TAKE these medications    amphetamine-dextroamphetamine 10 MG tablet Commonly known as: ADDERALL Take 1 tablet (10 mg total) by mouth every evening. What changed:  when to take this additional instructions   amphetamine-dextroamphetamine 15 MG tablet Commonly known as: ADDERALL Take 1 tablet by mouth in the morning. What changed: Another medication with the same name was changed. Make sure you understand how and when to take each.   dicyclomine 20 MG tablet Commonly known as: BENTYL Take 1 tablet (20 mg total) by mouth every 6 (six) hours.   docusate sodium 100 MG capsule Commonly known as: COLACE Take 1 capsule (100 mg total) by mouth 2 (two) times daily. What changed: when to take this   hydrocortisone-pramoxine rectal foam Commonly known as: PROCTOFOAM-HC Place 1 applicator rectally 2 (two) times daily.   hydrOXYzine 10 MG tablet Commonly known as: ATARAX Take 1 tablet (10 mg total) by mouth in the morning and at bedtime.   ibuprofen 200 MG tablet Commonly known as: ADVIL Take 400 mg by mouth every 6 (six) hours as needed for fever, mild pain or headache.        Discharge Exam: There were no vitals filed for this visit. General exam: awake, alert, no acute distress HEENT: atraumatic, clear conjunctiva, anicteric sclera, moist mucus membranes, hearing grossly normal  Respiratory system: CTAB, no wheezes, rales or rhonchi, normal respiratory effort. Cardiovascular system: normal S1/S2, RRR, no JVD, murmurs, rubs, gallops, no pedal edema.   Gastrointestinal system: soft, NT, ND, no HSM felt, +bowel  sounds. Central nervous system: A&O x4. no gross focal neurologic deficits, normal speech Extremities: moves all, no edema, normal tone Skin: dry, intact, normal temperature, normal color, No rashes, lesions or ulcers Psychiatry: anxious mood, congruent affect, judgement and insight appear normal   Condition at discharge: stable  The results of significant diagnostics from this hospitalization (including imaging, microbiology, ancillary and laboratory) are listed below for reference.   Imaging Studies: No results found.  Microbiology: Results for orders placed or performed during the hospital encounter of 09/02/22  Surgical PCR screen     Status: Abnormal   Collection Time: 09/03/22  1:18 AM   Specimen: Nasal Mucosa; Nasal Swab  Result Value Ref Range Status   MRSA, PCR NEGATIVE NEGATIVE Final   Staphylococcus aureus POSITIVE (A) NEGATIVE Final    Comment: (NOTE) The Xpert SA Assay (FDA approved for NASAL specimens in patients 32 years of age and older), is one component of a comprehensive surveillance program. It is not intended to diagnose infection nor to guide or monitor treatment. Performed at [facility], [address] [place], [place] [address]     Labs: CBC: Recent Labs  Lab 09/02/22 1430 09/02/22 1822 09/02/22 2323  09/03/22 0641  WBC 6.6  --   --   --   NEUTROABS 4.2  --   --   --   HGB 13.7 13.4 13.6 12.9  HCT 39.5 39.4 40.8 38.9  MCV 90.0  --   --   --   PLT 262  --   --   --    Basic Metabolic Panel: Recent Labs  Lab 09/02/22 1430 09/03/22 0641  NA 140 140  K 3.5 3.6  CL 107 110  CO2 27 25  GLUCOSE 96 97  BUN 10 14  CREATININE 0.70 0.59  CALCIUM 9.3 8.7*   Liver Function Tests: Recent Labs  Lab 09/02/22 1430  AST 14*  ALT 13  ALKPHOS 65  BILITOT 0.4  PROT 7.1  ALBUMIN 3.9   CBG: Recent Labs  Lab 09/02/22 2219  GLUCAP 103*    Discharge time spent: greater than 30 minutes.  Signed: Ezekiel Slocumb,  DO [facility] 09/03/2022

## 2022-09-03 NOTE — Anesthesia Preprocedure Evaluation (Addendum)
Anesthesia Evaluation  Patient identified by MRN, date of birth, ID band Patient awake    Reviewed: Allergy & Precautions, NPO status , Patient's Chart, lab work & pertinent test results  History of Anesthesia Complications Negative for: history of anesthetic complications  Airway Mallampati: I  TM Distance: >3 FB Neck ROM: Full    Dental  (+) Dental Advisory Given, Teeth Intact   Pulmonary Current Smoker and Patient abstained from smoking.   Pulmonary exam normal        Cardiovascular negative cardio ROS Normal cardiovascular exam     Neuro/Psych  Headaches PSYCHIATRIC DISORDERS Anxiety Depression       GI/Hepatic Neg liver ROS,GERD  Controlled,,  Endo/Other  negative endocrine ROS    Renal/GU negative Renal ROS     Musculoskeletal negative musculoskeletal ROS (+)    Abdominal   Peds  (+) ADHD Hematology negative hematology ROS (+)   Anesthesia Other Findings   Reproductive/Obstetrics  s/p tubal ligation                              Anesthesia Physical Anesthesia Plan  ASA: 2  Anesthesia Plan: MAC   Post-op Pain Management: Minimal or no pain anticipated   Induction:   PONV Risk Score and Plan: 1 and Propofol infusion and Treatment may vary due to age or medical condition  Airway Management Planned: Natural Airway and Simple Face Mask  Additional Equipment: None  Intra-op Plan:   Post-operative Plan:   Informed Consent: I have reviewed the patients History and Physical, chart, labs and discussed the procedure including the risks, benefits and alternatives for the proposed anesthesia with the patient or authorized representative who has indicated his/her understanding and acceptance.       Plan Discussed with: CRNA and Anesthesiologist  Anesthesia Plan Comments:        Anesthesia Quick Evaluation

## 2022-09-03 NOTE — Anesthesia Postprocedure Evaluation (Signed)
Anesthesia Post Note  Patient: [name]  Procedure(s) Performed: [place]     Patient location during evaluation: PACU Anesthesia Type: MAC Level of consciousness: awake and alert Pain management: pain level controlled Vital Signs Assessment: post-procedure vital signs reviewed and stable Respiratory status: spontaneous breathing, nonlabored ventilation and respiratory function stable Cardiovascular status: stable and blood pressure returned to baseline Anesthetic complications: no   No notable events documented.  Last Vitals:  Vitals:   09/03/22 1152 09/03/22 1314  BP: (!) 91/54 90/66  Pulse: 67 63  Resp: 16 17  Temp:  37.1 C  SpO2: 99% 98%    Last Pain:  Vitals:   09/03/22 1314  TempSrc: Oral  PainSc:                  Audry Pili

## 2022-09-03 NOTE — Progress Notes (Signed)
Patient discharged home.  Discharge instructions explained, patient verbalizes understanding 

## 2022-09-03 NOTE — Progress Notes (Signed)
  Transition of Care [facility]) Screening Note   Patient Details  Name: [name] Date of Birth: 1991-01-10   Transition of Care [facility]) CM/SW Contact:    Adrian Prows, RN Phone Number: [phone] 09/03/2022, 4:40 PM  Transition of Care Department [facility]) has reviewed patient and no TOC needs have been identified at this time. We will continue to monitor patient advancement through interdisciplinary progression rounds. If new patient transition needs arise, please place a TOC consult.

## 2022-09-03 NOTE — Transfer of Care (Signed)
Immediate Anesthesia Transfer of Care Note  Patient: [name]  Procedure(s) Performed: FLEXIBLE SIGMOIDOSCOPY  Patient Location: PACU  Anesthesia Type:MAC  Level of Consciousness: awake, alert , and oriented  Airway & Oxygen Therapy: Patient Spontanous Breathing and Patient connected to face mask oxygen  Post-op Assessment: Report given to RN, Post -op Vital signs reviewed and stable, and Patient moving all extremities X 4  Post vital signs: Reviewed and stable  Last Vitals:  Vitals Value Taken Time  BP 89/54   Temp    Pulse 73 09/03/22 1144  Resp 12   SpO2 100 % 09/03/22 1144  Vitals shown include unvalidated device data.  Last Pain:  Vitals:   09/03/22 1110  TempSrc: Temporal  PainSc: 6       Patients Stated Pain Goal: 1 (07/68/08 8110)  Complications: No notable events documented.

## 2022-09-03 NOTE — Discharge Instructions (Signed)
--  Use Benefiber one teaspoon PO daily with 8 ounces of water. -- ProctoFoam-HC: Apply externally BID for 10 days. -- Sitz bath's twice daily

## 2022-09-03 NOTE — Op Note (Addendum)
[facility] Patient Name: Hannah LimboHaley Clapper Procedure Date: 09/03/2022 MRN: [id number] Attending MD: Lynann Bolognaajesh Adhira Jamil , MD, [phone] Date of Birth: 1991/02/26 CSN: [id number] Age: [age] Admit Type: Inpatient Procedure:                Flexible Sigmoidoscopy Indications:              Rectal bleeding. Providers:                Lynann Bolognaajesh Zunaira Lamy, MD, Doree AlbeeShelly Baker, RN, Rozetta NunneryAnthony                            Gillies, Technician Referring MD:             Dr. Tiajuana AmassScott Cunningham. Medicines:                Propofol per Anesthesia Complications:            No immediate complications. Estimated Blood Loss:     Estimated blood loss: none. Procedure:                Pre-Anesthesia Assessment:                           - Prior to the procedure, a History and Physical                            was performed, and patient medications and                            allergies were reviewed. The patient's tolerance of                            previous anesthesia was also reviewed. The risks                            and benefits of the procedure and the sedation                            options and risks were discussed with the patient.                            All questions were answered, and informed consent                            was obtained. Prior Anticoagulants: The patient has                            taken no anticoagulant or antiplatelet agents. ASA                            Grade Assessment: II - A patient with mild systemic                            disease. After reviewing the risks and benefits,  the patient was deemed in satisfactory condition to                            undergo the procedure.                           After obtaining informed consent, the scope was                            passed under direct vision. The [id number]-[id number]                            ([id number]) Olympus colonoscope was introduced                            through the and  advanced to the the descending                            colon, up to 50 cm from the anal verge. Stopped due                            to retained stool. The flexible sigmoidoscopy was                            accomplished without difficulty. The patient                            tolerated the procedure well. The quality of the                            bowel preparation was good. Scope In: Scope Out: Findings:      Prolapsed hemorrhoids were found on perianal exam. There was minor       degree of rectal prolapse as well.      Non-bleeding internal hemorrhoids were found during retroflexion and       during perianal exam. The hemorrhoids were moderate and Grade III       (internal hemorrhoids that prolapse but require manual reduction) with       one hemorrhoid grade IV. A well-healed scar was noted just above the       dentate line consistent with previous hemorrhoid banding. No ulcers or       erosions were noted. No active bleeding. Impression:               - Significant prolapsed internal hemorrhoids. No                            active bleeding.                           - Well-healed hemorrhoidal banding scar without any                            bleeding.                           -  Minor degree of rectal prolapse.                           - No specimens collected. Moderate Sedation:      Not Applicable - Patient had care per Anesthesia. Recommendation:           - Discharge patient to home.                           - Use Benefiber one teaspoon PO daily with 8 ounces                            of water.                           - ProctoFoam-HC: Apply externally BID for 10 days.                           - Sitz bath's twice daily                           - Reasonable to pursue another attempt at                            hemorrhoidal banding. She already has follow-up                            appointment in coming days.                           - If still with  problems, would recommend surgical                            evaluation for possible EUA/hemorrhoidectomy.                           - The findings and recommendations were discussed                            with the patient's family (Adam). Procedure Code(s):        --- Professional ---                           [id number], Sigmoidoscopy, flexible; diagnostic,                            including collection of specimen(s) by brushing or                            washing, when performed (separate procedure) Diagnosis Code(s):        --- Professional ---                           Q11.9, Third degree hemorrhoids  K62.5, Hemorrhage of anus and rectum CPT copyright 2022 [employer]. All rights reserved. The codes documented in this report are preliminary and upon coder review may  be revised to meet current compliance requirements. Lynann Bologna, MD 09/03/2022 11:52:20 AM This report has been signed electronically. Number of Addenda: 0

## 2022-09-05 ENCOUNTER — Encounter (HOSPITAL_COMMUNITY): Payer: Self-pay | Admitting: Gastroenterology

## 2022-09-13 ENCOUNTER — Encounter: Payer: Self-pay | Admitting: Gastroenterology

## 2022-09-13 ENCOUNTER — Ambulatory Visit (INDEPENDENT_AMBULATORY_CARE_PROVIDER_SITE_OTHER): Payer: Medicaid Other | Admitting: Gastroenterology

## 2022-09-13 VITALS — BP 98/68 | HR 65 | Ht 65.0 in | Wt 124.4 lb

## 2022-09-13 DIAGNOSIS — K642 Third degree hemorrhoids: Secondary | ICD-10-CM

## 2022-09-13 DIAGNOSIS — G8929 Other chronic pain: Secondary | ICD-10-CM

## 2022-09-13 DIAGNOSIS — R1031 Right lower quadrant pain: Secondary | ICD-10-CM | POA: Diagnosis not present

## 2022-09-13 NOTE — Progress Notes (Signed)
HPI : [name] is a very pleasant 32 year old female who I initially saw in June with chief complaint of hematochezia and a family history of colon cancer.  She underwent a colonoscopy July 6 which was normal except for prominent grade 3 hemorrhoids.  She elected to proceed with hemorrhoid banding.  On October 9 the left lateral hemorrhoid column was banded.  She presented on November 17 for second banding and at that time said that the change in her hemorrhoid symptoms have been minimal with the first banding.  The right posterior column was banded at that time.  She presented to the emergency department on December 22 with profuse bleeding that had been persisting for a few days.  She reports passing a blood clot "the size of my fist".  She reports feeling lightheaded and dizzy.  Her hemoglobin was stable from her baseline.  She was admitted and underwent a flexible sigmoidoscopy on December 23 which showed large grade 3 hemorrhoids, as well as a grade 4 hemorrhoid.  A well-healed hemorrhoid banding scar was noted without any evidence of bleeding or stigmata.  Her bleeding was attributed to hemorrhoids.  Today, the patient reports ongoing symptoms of bothersome hemorrhoids (bleeding, prolapse, pressure/discomfort).  She reports only minor improvement in her symptoms after 2 bands were placed.  She reports having daily bowel movements without hard stools or straining.  She takes Colace twice a day.  Diarrhea and constipation are not issues for her.  She denies sitting on the toilet for prolonged periods.  She also has chronic abdominal discomfort and nausea.  The abdominal discomfort is described as being similar to hunger pains.  Sometimes she has significant bouts of right lower quadrant pain and generalized abdominal pain which can precede the urge to defecate.  She had previously been prescribed Bentyl and IBgard.  She said the IBgard helped a little, but she has not been taking the  Bentyl.     Colonoscopy March 19, 2022 - Normal, except for grade 3 internal hemorrhoids - Repeat in 5 years due to family history  Flexible sigmoidoscopy September 03, 2022 -Grade 3/4 hemorrhoids, well-healed hemorrhoid banding scar  Past Medical History:  Diagnosis Date   ADHD (attention deficit hyperactivity disorder), inattentive type    Anemia    with pregnancy   Anxiety    Chronic headaches    began late 2012   Depression    GERD (gastroesophageal reflux disease)    History of kidney stones    x 4, first time age 27yo   Previous cesarean delivery, antepartum condition or complication 04/01/2011   Seasonal allergic rhinitis      Past Surgical History:  Procedure Laterality Date   CESAREAN SECTION     CHOLECYSTECTOMY, LAPAROSCOPIC  2021   COLONOSCOPY  07/20/2017   per Dr. Janna Arch at [facility], single benign polyp, repeat in 5 yrs (mother had colon cancer)   COLONOSCOPY  03/17/2022   COLONOSCOPY  09/03/2022   FLEXIBLE SIGMOIDOSCOPY N/A 09/03/2022   Procedure: FLEXIBLE SIGMOIDOSCOPY;  Surgeon: Lynann Bologna, MD;  Location: [facility];  Service: Gastroenterology;  Laterality: N/A;   TONSILLECTOMY     TUBAL LIGATION Bilateral 08/14/2015   Procedure: POST PARTUM TUBAL LIGATION;  Surgeon: Geryl Rankins, MD;  Location: [facility];  Service: Gynecology;  Laterality: Bilateral;   Family History  Problem Relation Age of Onset   Colon cancer Mother    Hypertension Mother    Cancer Mother 97  colon   Diabetes Mother        borderline   Thyroid disease Mother    Depression Mother    Anxiety disorder Mother    Other Father        his health hx unknown   ADD / ADHD Sister    Heart disease Maternal Uncle    Diabetes Maternal Grandmother    Hypertension Maternal Grandfather    Heart disease Maternal Grandfather    Brain cancer Paternal Grandmother        Bettylee thinks   Heart murmur Daughter    Cancer Other        others on maternal side   Stroke Neg Hx     Social History   Tobacco Use   Smoking status: Former    Years: 7.00    Types: Cigarettes    Passive exposure: Past   Smokeless tobacco: Never   Tobacco comments:    4-5 cigarettes per day  Vaping Use   Vaping Use: Every day  Substance Use Topics   Alcohol use: No    Alcohol/week: 0.0 standard drinks of alcohol   Drug use: No   Current Outpatient Medications  Medication Sig Dispense Refill   amphetamine-dextroamphetamine (ADDERALL) 10 MG tablet Take 1 tablet (10 mg total) by mouth every evening. (Patient taking differently: Take 10 mg by mouth See admin instructions. Take 10 mg by mouth in the evening as needed/as directed by provider) 30 tablet 0   docusate sodium (COLACE) 100 MG capsule Take 1 capsule (100 mg total) by mouth 2 (two) times daily. (Patient taking differently: Take 100 mg by mouth in the morning and at bedtime.) 180 capsule 3   hydrocortisone-pramoxine (PROCTOFOAM-HC) rectal foam Place 1 applicator rectally 2 (two) times daily. 10 g 0   hydrOXYzine (ATARAX) 10 MG tablet Take 1 tablet (10 mg total) by mouth in the morning and at bedtime. 180 tablet 3   ibuprofen (ADVIL) 200 MG tablet Take 400 mg by mouth every 6 (six) hours as needed for fever, mild pain or headache.     amphetamine-dextroamphetamine (ADDERALL) 15 MG tablet Take 1 tablet by mouth in the morning. 30 tablet 0   dicyclomine (BENTYL) 20 MG tablet Take 1 tablet (20 mg total) by mouth every 6 (six) hours. (Patient not taking: Reported on 07/29/2022) 30 tablet 1   No current facility-administered medications for this visit.   No Known Allergies   Review of Systems: All systems reviewed and negative except where noted in HPI.    No results found.  Physical Exam: BP 98/68   Pulse 65   Ht 5\' 5"  (1.651 m)   Wt 124 lb 6.4 oz (56.4 kg)   SpO2 99%   BMI 20.70 kg/m  Constitutional: Pleasant,well-developed, Caucasian female in no acute distress. Abdominal: Soft, nondistended, nontender. Bowel sounds  active throughout. There are no masses palpable. No hepatomegaly. Extremities: no edema Neurological: Alert and oriented to person place and time. Skin: Skin is warm and dry. No rashes noted. Psychiatric: Normal mood and affect. Behavior is normal.  CBC    Component Value Date/Time   WBC 6.6 09/02/2022 1430   RBC 4.39 09/02/2022 1430   HGB 12.9 09/03/2022 0641   HCT 38.9 09/03/2022 0641   PLT 262 09/02/2022 1430   MCV 90.0 09/02/2022 1430   MCH 31.2 09/02/2022 1430   MCHC 34.7 09/02/2022 1430   RDW 12.0 09/02/2022 1430   LYMPHSABS 2.0 09/02/2022 1430   MONOABS 0.4 09/02/2022 1430  EOSABS 0.0 09/02/2022 1430   BASOSABS 0.0 09/02/2022 1430    CMP     Component Value Date/Time   NA 140 09/03/2022 0641   K 3.6 09/03/2022 0641   CL 110 09/03/2022 0641   CO2 25 09/03/2022 0641   GLUCOSE 97 09/03/2022 0641   BUN 14 09/03/2022 0641   CREATININE 0.59 09/03/2022 0641   CREATININE 0.76 12/14/2011 1024   CALCIUM 8.7 (L) 09/03/2022 0641   PROT 7.1 09/02/2022 1430   ALBUMIN 3.9 09/02/2022 1430   AST 14 (L) 09/02/2022 1430   ALT 13 09/02/2022 1430   ALKPHOS 65 09/02/2022 1430   BILITOT 0.4 09/02/2022 1430   GFRNONAA >60 09/03/2022 0641   GFRAA >90 01/23/2013 1216     ASSESSMENT AND PLAN: 32 year old female with grade 3/4 hemorrhoids which have been banded x 2 with minimal clinical improvement.  Although further banding can be considered, I informed her that given the minimal improvement thus far and the progression of her hemorrhoids since her last ligation is a likely indicator that band ligation is not going to be adequate for her.  I recommended she discuss a hemorrhoidectomy.  If hemorrhoidectomy is not offered or if the patient decides that she does not want it, then we can reconsider banding, although I told her the likelihood of complete success with banding is low. 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  She  admits she has issues with anxiety and has 5 children at home and so is constantly stressed.  I recommended she try the Bentyl as previously recommended or continue the IBgard as she said it was helpful before.  We also discussed the principles of a low FODMAP diet and she was provided further information on this diet.  Internal hemorrhoids - Refer to colorectal surgery for consideration of hemorrhoidectomy - Further banding can be considered, but seems to have low likelihood of success  Chronic abdominal pain - Bentyl +/- IBgard - Consider low FODMAP diet  Gianelle Mccaul E. Candis Schatz, MD [facility]  CC:  Antonietta Jewel, MD

## 2022-09-13 NOTE — Patient Instructions (Addendum)
If you are age 34 or older, your body mass index should be between 23-30. Your Body mass index is 20.7 kg/m. If this is out of the aforementioned range listed, please consider follow up with your Primary Care Provider.  If you are age 49 or younger, your body mass index should be between 19-25. Your Body mass index is 20.7 kg/m. If this is out of the aformentioned range listed, please consider follow up with your Primary Care Provider.   We have sent a referral to [facility] and they will call you to make an appointment.   Take Bentyl as needed.  Try IB guard.  The [facility] providers would like to encourage you to use [facility] to communicate with providers for non-urgent requests or questions.  Due to long hold times on the telephone, sending your provider a message by [facility] may be a faster and more efficient way to get a response.  Please allow 48 business hours for a response.  Please remember that this is for non-urgent requests.   It was a pleasure to see you today!  Thank you for trusting me with your gastrointestinal care!    Scott E. Candis Schatz, MD

## 2022-09-21 ENCOUNTER — Telehealth: Payer: Self-pay | Admitting: Gastroenterology

## 2022-09-21 NOTE — Telephone Encounter (Signed)
Patient called to follow up on a referral to [facility] but has not heard from anyone about it. Please advise.

## 2022-09-22 NOTE — Telephone Encounter (Signed)
Patient called to follow up on referral. Stated she called [facility] and they stated they had no referral for her. Please advise.

## 2022-09-22 NOTE — Telephone Encounter (Signed)
Called patient and let her know referral has been faxed over. I also Left a VM for [facility] to return call.

## 2022-09-22 NOTE — Telephone Encounter (Signed)
Maya have you received any response from [facility] in regards to referral. I am sorry if you did not make this referral I saw your name on the note. Let me know if I need to send to someone else.

## 2022-10-19 ENCOUNTER — Other Ambulatory Visit: Payer: Self-pay | Admitting: Gastroenterology

## 2022-11-03 ENCOUNTER — Ambulatory Visit: Payer: Medicaid Other | Admitting: Internal Medicine

## 2022-12-06 ENCOUNTER — Other Ambulatory Visit: Payer: Self-pay | Admitting: Family Medicine

## 2022-12-07 MED ORDER — AMPHETAMINE-DEXTROAMPHETAMINE 10 MG PO TABS: 10.0000 mg | ORAL_TABLET | Freq: Every evening | ORAL | 0 refills | Status: DC

## 2022-12-07 MED ORDER — AMPHETAMINE-DEXTROAMPHETAMINE 15 MG PO TABS: 1.0000 | ORAL_TABLET | Freq: Every morning | ORAL | 0 refills | Status: DC

## 2022-12-07 NOTE — Telephone Encounter (Signed)
Done

## 2023-07-31 ENCOUNTER — Other Ambulatory Visit: Payer: Self-pay | Admitting: Family Medicine

## 2023-08-02 NOTE — Telephone Encounter (Signed)
Attempted to call pt to schedule office visit for a CPE/ Med refills no option of leaving message. Will try later

## 2023-08-18 ENCOUNTER — Telehealth: Payer: Self-pay

## 2023-08-18 ENCOUNTER — Other Ambulatory Visit (HOSPITAL_COMMUNITY): Payer: Self-pay

## 2023-08-18 NOTE — Telephone Encounter (Signed)
Pharmacy Patient Advocate Encounter   Received notification from CoverMyMeds that prior authorization for Amphetamine-Dextroamphetamine 10MG  tablets is required/requested.   Insurance verification completed.   The patient is insured through [facility] MEDICAID .   Per test claim: PA required; PA submitted to above mentioned insurance via CoverMyMeds Key/confirmation #/EOC Key: [id number]       Status is pending

## 2023-08-18 NOTE — Telephone Encounter (Signed)
Done

## 2023-08-22 ENCOUNTER — Other Ambulatory Visit (HOSPITAL_COMMUNITY): Payer: Self-pay

## 2023-08-22 NOTE — Telephone Encounter (Signed)
Pharmacy Patient Advocate Encounter  Received notification from [facility] that Prior Authorization for Amphetamine-Dextroamphetamine 10MG  has been CANCELLED due to  Prior Authorization not needed. Per test claim. Rx is payable again on/after 1.1.25

## 2023-09-18 ENCOUNTER — Ambulatory Visit: Payer: Medicaid Other | Admitting: Family Medicine

## 2024-02-07 ENCOUNTER — Telehealth: Payer: Self-pay | Admitting: Family Medicine

## 2024-02-07 ENCOUNTER — Ambulatory Visit (INDEPENDENT_AMBULATORY_CARE_PROVIDER_SITE_OTHER): Admitting: Family Medicine

## 2024-02-07 ENCOUNTER — Encounter: Payer: Self-pay | Admitting: Family Medicine

## 2024-02-07 VITALS — BP 110/74 | HR 73 | Temp 98.4°F | Wt 125.4 lb

## 2024-02-07 DIAGNOSIS — F418 Other specified anxiety disorders: Secondary | ICD-10-CM | POA: Insufficient documentation

## 2024-02-07 DIAGNOSIS — F900 Attention-deficit hyperactivity disorder, predominantly inattentive type: Secondary | ICD-10-CM | POA: Diagnosis not present

## 2024-02-07 DIAGNOSIS — K644 Residual hemorrhoidal skin tags: Secondary | ICD-10-CM | POA: Insufficient documentation

## 2024-02-07 MED ORDER — AMPHETAMINE-DEXTROAMPHETAMINE 20 MG PO TABS: 20.0000 mg | ORAL_TABLET | Freq: Every morning | ORAL | 0 refills | Status: DC

## 2024-02-07 MED ORDER — ESCITALOPRAM OXALATE 10 MG PO TABS: 10.0000 mg | ORAL_TABLET | Freq: Every day | ORAL | 2 refills | Status: DC

## 2024-02-07 NOTE — Telephone Encounter (Signed)
 Yes I agreed to see all her children

## 2024-02-07 NOTE — Telephone Encounter (Signed)
 Pt states provider agreed to take on her children as new patients. Please confirm

## 2024-02-07 NOTE — Progress Notes (Signed)
   Subjective:    Patient ID: [name], female    DOB: Mar 07, 1991, 33 y.o.   MRN: [id number]  HPI Here for several issues. First she has been dealing with bleeding external hemorrhoids for several years. She has undergone a banding procedure, but this was not successful. She is now considering having a surgery for this. Also she has been off medications for anxiety and ADHD for several years, and she wants to get back on these. She has been under a lot of stress, and she feels under pressure to work her job and to care for her 3 children. She took Adderall and Lexapro in the past, and these worked well for her.    Review of Systems  Constitutional: Negative.   Respiratory: Negative.    Cardiovascular: Negative.   Gastrointestinal:  Positive for anal bleeding. Negative for rectal pain.  Psychiatric/Behavioral:  Positive for decreased concentration. Negative for agitation, behavioral problems, confusion, dysphoric mood, hallucinations and sleep disturbance. The patient is nervous/anxious.        Objective:   Physical Exam Constitutional:      Appearance: Normal appearance.  Cardiovascular:     Rate and Rhythm: Normal rate and regular rhythm.     Pulses: Normal pulses.     Heart sounds: Normal heart sounds.  Pulmonary:     Effort: Pulmonary effort is normal.     Breath sounds: Normal breath sounds.  Neurological:     Mental Status: She is alert.           Assessment & Plan:  We will refer her to Surgery to consider having a hemorrhoidectomy for the bleeding hemorrhoids. For the anxiety, she will get back on Lexapro 10 mg daily. For the ADHD, she will try Adderall 20 mg every morning.  Corita Diego, MD

## 2024-03-20 ENCOUNTER — Encounter: Payer: Self-pay | Admitting: Family Medicine

## 2024-03-20 DIAGNOSIS — L989 Disorder of the skin and subcutaneous tissue, unspecified: Secondary | ICD-10-CM

## 2024-03-20 MED ORDER — ESCITALOPRAM OXALATE 10 MG PO TABS: 10.0000 mg | ORAL_TABLET | Freq: Every day | ORAL | 3 refills | Status: DC

## 2024-03-20 NOTE — Telephone Encounter (Signed)
 I refilled the Lexapro , and I did the referral to Dermatology

## 2024-04-05 ENCOUNTER — Other Ambulatory Visit: Payer: Self-pay

## 2024-04-05 ENCOUNTER — Emergency Department (HOSPITAL_BASED_OUTPATIENT_CLINIC_OR_DEPARTMENT_OTHER)
Admission: EM | Admit: 2024-04-05 | Discharge: 2024-04-05 | Disposition: A | Discharge status: Home / Self Care | Attending: Emergency Medicine | Admitting: Emergency Medicine

## 2024-04-05 ENCOUNTER — Emergency Department (HOSPITAL_BASED_OUTPATIENT_CLINIC_OR_DEPARTMENT_OTHER)

## 2024-04-05 ENCOUNTER — Encounter (HOSPITAL_BASED_OUTPATIENT_CLINIC_OR_DEPARTMENT_OTHER): Payer: Self-pay | Admitting: Emergency Medicine

## 2024-04-05 DIAGNOSIS — Z85828 Personal history of other malignant neoplasm of skin: Secondary | ICD-10-CM | POA: Insufficient documentation

## 2024-04-05 DIAGNOSIS — M545 Low back pain, unspecified: Secondary | ICD-10-CM

## 2024-04-05 DIAGNOSIS — S39012A Strain of muscle, fascia and tendon of lower back, initial encounter: Secondary | ICD-10-CM | POA: Insufficient documentation

## 2024-04-05 DIAGNOSIS — S3992XA Unspecified injury of lower back, initial encounter: Secondary | ICD-10-CM | POA: Diagnosis present

## 2024-04-05 DIAGNOSIS — X500XXA Overexertion from strenuous movement or load, initial encounter: Secondary | ICD-10-CM | POA: Diagnosis not present

## 2024-04-05 DIAGNOSIS — T148XXA Other injury of unspecified body region, initial encounter: Secondary | ICD-10-CM

## 2024-04-05 LAB — HCG, SERUM, QUALITATIVE: Preg, Serum: NEGATIVE

## 2024-04-05 MED ORDER — METHOCARBAMOL 500 MG PO TABS: 500.0000 mg | ORAL_TABLET | Freq: Two times a day (BID) | ORAL | 0 refills | Status: DC

## 2024-04-05 MED ORDER — OXYCODONE-ACETAMINOPHEN 5-325 MG PO TABS: 1.0000 | ORAL_TABLET | Freq: Four times a day (QID) | ORAL | 0 refills | Status: DC | PRN

## 2024-04-05 MED ORDER — LIDOCAINE 5 % EX PTCH
1.0000 | MEDICATED_PATCH | CUTANEOUS | Status: DC
  Administered 2024-04-05: 1 via TRANSDERMAL
  Filled 2024-04-05: qty 1

## 2024-04-05 MED ORDER — OXYCODONE-ACETAMINOPHEN 5-325 MG PO TABS
1.0000 | ORAL_TABLET | Freq: Once | ORAL | Status: AC
  Administered 2024-04-05: 1 via ORAL
  Filled 2024-04-05: qty 1

## 2024-04-05 MED ORDER — METHOCARBAMOL 500 MG PO TABS
500.0000 mg | ORAL_TABLET | Freq: Once | ORAL | Status: AC
  Administered 2024-04-05: 500 mg via ORAL
  Filled 2024-04-05: qty 1

## 2024-04-05 NOTE — ED Triage Notes (Signed)
 Pt POV slow gait- pt requested not to sit in triage due to pain- pt c/o lower L back pain after helping friends move today, worsening over last 2 hours.   Took muscle relaxer pta.

## 2024-04-05 NOTE — ED Notes (Signed)

## 2024-04-05 NOTE — ED Notes (Signed)
 Pt walked to the bathroom for a uPreg.

## 2024-04-05 NOTE — ED Provider Notes (Signed)
 [place] [facility] Provider Note   CSN: [id number] Arrival date & time: 04/05/24  1523     Patient presents with: Back Pain   [name] is a 33 y.o. female presents to emergency department for evaluation of lower back pain that started following helping her friend move this morning.  She reports that they were moving canned goods and heavy equipment when she twisted weird and felt something pop.  She took meloxicam 15 mg prior to arrival and used a heating pad without relief.  Does have a history of basal cell carcinoma 8 years ago.  Denies history of IVDU, loss of sensation in genital region, urinary incontinence, urinary symptoms, paresthesia or weakness.    Back Pain      Prior to Admission medications   Medication Sig Start Date End Date Taking? Authorizing Provider  methocarbamol  (ROBAXIN ) 500 MG tablet Take 1 tablet (500 mg total) by mouth 2 (two) times daily. 04/05/24  Yes Minnie Tinnie BRAVO, PA  oxyCODONE -acetaminophen  (PERCOCET/ROXICET) 5-325 MG tablet Take 1 tablet by mouth every 6 (six) hours as needed for severe pain (pain score 7-10). 04/05/24  Yes Minnie Tinnie BRAVO, PA  amphetamine -dextroamphetamine  (ADDERALL) 20 MG tablet Take 1 tablet (20 mg total) by mouth every morning. 02/07/24   Johnny Garnette LABOR, MD  dicyclomine  (BENTYL ) 20 MG tablet TAKE ONE TABLET BY MOUTH EVERY 6 HOURS 10/19/22   Stacia Glendia BRAVO, MD  docusate sodium  (COLACE) 100 MG capsule Take 1 capsule (100 mg total) by mouth 2 (two) times daily. Patient taking differently: Take 100 mg by mouth in the morning and at bedtime. 12/10/21   Johnny Garnette LABOR, MD  escitalopram  (LEXAPRO ) 10 MG tablet Take 1 tablet (10 mg total) by mouth daily. 03/20/24   Johnny Garnette LABOR, MD  hydrocortisone -pramoxine (PROCTOFOAM -HC) rectal foam Place 1 applicator rectally 2 (two) times daily. 09/03/22   Fausto Burnard LABOR, DO  hydrOXYzine  (ATARAX ) 10 MG tablet TAKE ONE TABLET BY MOUTH IN THE MORNING AND  AT BEDTIME 08/18/23   Johnny Garnette LABOR, MD  ibuprofen  (ADVIL ) 200 MG tablet Take 400 mg by mouth every 6 (six) hours as needed for fever, mild pain or headache.    [provider]    Allergies: Patient has no known allergies.    Review of Systems  Musculoskeletal:  Positive for back pain.    Updated Vital Signs BP 117/74 (BP Location: Right Arm)   Pulse 92   Temp 98.8 F (37.1 C)   Resp 18   Ht 5' 5 (1.651 m)   LMP 03/26/2024   SpO2 98%   BMI 20.87 kg/m   Physical Exam Vitals and nursing note reviewed.  Constitutional:      General: She is not in acute distress.    Appearance: Normal appearance.  HENT:     Head: Normocephalic and atraumatic.  Eyes:     Conjunctiva/sclera: Conjunctivae normal.  Cardiovascular:     Rate and Rhythm: Normal rate.  Pulmonary:     Effort: Pulmonary effort is normal. No respiratory distress.     Breath sounds: Normal breath sounds.  Musculoskeletal:     Cervical back: No bony tenderness. No pain with movement.     Thoracic back: No bony tenderness.     Lumbar back: Tenderness and bony tenderness present.       Back:     Right lower leg: No edema.     Left lower leg: No edema.  Skin:  Capillary Refill: Capillary refill takes less than 2 seconds.     Coloration: Skin is not jaundiced or pale.  Neurological:     Mental Status: She is alert and oriented to person, place, and time. Mental status is at baseline.     Cranial Nerves: No cranial nerve deficit.     Sensory: No sensory deficit.     Motor: No weakness.     Coordination: Coordination normal.     Gait: Gait normal.     Deep Tendon Reflexes: Reflexes normal.     Comments: Motor 5/5 of BLE and sensation 2/2 of dermatomes L2-S2 bilaterally.  Sharp sensation intact.  Ambulates gingerly secondary to pain but is able to equally weight-bear on bilateral lower extremities.     (all labs ordered are listed, but only abnormal results are displayed) Labs Reviewed  HCG, SERUM,  QUALITATIVE    EKG: None  Radiology: CT Lumbar Spine Wo Contrast Result Date: 04/05/2024 CLINICAL DATA:  Trauma low back pain EXAM: CT LUMBAR SPINE WITHOUT CONTRAST TECHNIQUE: Multidetector CT imaging of the lumbar spine was performed without intravenous contrast administration. Multiplanar CT image reconstructions were also generated. RADIATION DOSE REDUCTION: This exam was performed according to the departmental dose-optimization program which includes automated exposure control, adjustment of the mA and/or kV according to patient size and/or use of iterative reconstruction technique. COMPARISON:  None Available. FINDINGS: Segmentation: 5 lumbar type vertebrae. Alignment: Normal. Vertebrae: No acute fracture or focal pathologic process. Paraspinal and other soft tissues: No acute paravertebral or paraspinal soft tissue abnormality. Small bilateral kidney stones. Disc levels: Patent disc spaces. No significant canal stenosis or foraminal narrowing IMPRESSION: 1. Negative CT appearance of the lumbar spine. 2. Small bilateral kidney stones. Electronically Signed   By: Luke Bun M.D.   On: 04/05/2024 17:45      Medications Ordered in the ED  lidocaine  (LIDODERM ) 5 % 1 patch (1 patch Transdermal Patch Applied 04/05/24 1641)  methocarbamol  (ROBAXIN ) tablet 500 mg (500 mg Oral Given 04/05/24 1641)  oxyCODONE -acetaminophen  (PERCOCET/ROXICET) 5-325 MG per tablet 1 tablet (1 tablet Oral Given 04/05/24 1810)                                    Medical Decision Making Amount and/or Complexity of Data Reviewed Labs: ordered. Radiology: ordered.  Risk Prescription drug management.   Patient presents to the ED for concern of low back pain, this involves an extensive number of treatment options, and is a complaint that carries with it a high risk of complications and morbidity.  The differential diagnosis includes compression fracture, DDD, herniated disc, muscle strain, cauda equina   Co  morbidities that complicate the patient evaluation  See hpi   Additional history obtained:  Additional history obtained from Nursing   External records from outside source obtained and reviewed including triage RN note     Imaging Studies ordered:  I ordered imaging studies including CT lumbar spine  I independently visualized and interpreted imaging which showed no acute traumatic injury I agree with the radiologist interpretation    Medicines ordered and prescription drug management:  I ordered medication including robaxin , percocet, lidocaine  patch  for pain  Reevaluation of the patient after these medicines showed that the patient improved I have reviewed the patients home medicines and have made adjustments as needed     Problem List / ED Course:  Lumbar back pain Muscle strain Neurologically intact  with no motor nor sensory deficits.  Able to weight-bear equally of bilateral lower extremities. Does have tenderness to left paraspinous musculature in lumbar region as well as bony processes in lumbar region Did obtain CT imaging as she does have lumbar spinous process tenderness to rule out herniated disc, disc rupture, compression fracture.  Fortunately this is negative for acute traumatic injury Low suspicion for cauda equina as she has no history of IVDU nor urinary incontinence nor saddle paresthesia Provided patient with Robaxin , lidocaine  patch, Percocet for pain with improvement.  Will provide prescription.  Discussed taking Percocet and Robaxin  separately as they both can cause decreased respiratory drive and cause drowsiness.  Also discussed drowsy properties of both these medications While patient follow-up with primary care provider for further management.  Did provide work note to ensure the patient does not lift more than 5 pounds over the next 2 weeks and decrease walking, prolonged standing as well as discussed symptomatic care.    Reevaluation:  After  the interventions noted above, I reevaluated the patient and found that they have :improved   Social Determinants of Health:  Has PCP   Dispostion:  After consideration of the diagnostic results and the patients response to treatment, I feel that the patent would benefit from outpatient management with symptomatic care.   Discussed ED workup, disposition, return to ED precautions with patient who expresses understanding agrees with plan.  All questions answered to their satisfaction.  They are agreeable to plan.  Discharge instructions provided on paperwork  Final diagnoses:  Lumbar back pain  Muscle strain    ED Discharge Orders          Ordered    methocarbamol  (ROBAXIN ) 500 MG tablet  2 times daily        04/05/24 1826    oxyCODONE -acetaminophen  (PERCOCET/ROXICET) 5-325 MG tablet  Every 6 hours PRN        04/05/24 1826               Minnie Tinnie BRAVO, PA 04/05/24 1924    Doretha Folks, MD 04/06/24 1322

## 2024-04-05 NOTE — Discharge Instructions (Addendum)
 Thank you for letting us  evaluate you today.  Your CT imaging of your back did not show any acute traumatic injury following injury today.  We have provided you with muscle relaxer/Robaxin, lidocaine  patch, Percocet here Emergency Department for pain.  Have also provided you with prescription for Robaxin, Percocet to use as needed.  Please separate administration approximately 3-4 hours as they both can cause decreased respiratory drive.  You may use topical magnesium , lidocaine  patches, Voltaren gel for topical pain relief.  You may also use ibuprofen  for pain but do not use Tylenol  and Percocet together as they both have Tylenol  in them. You can take the muscle relaxer and pain medicine tomorrow  Return to Emergency Department if you experience urinary incontinence, loss of sensation in genital region, worsening of symptoms

## 2024-04-10 ENCOUNTER — Telehealth: Payer: Self-pay | Admitting: Family Medicine

## 2024-04-10 MED ORDER — AMPHETAMINE-DEXTROAMPHETAMINE 20 MG PO TABS: 20.0000 mg | ORAL_TABLET | Freq: Every morning | ORAL | 0 refills | Status: DC

## 2024-04-10 NOTE — Telephone Encounter (Signed)
 Disregard, medications already refilled.

## 2024-04-10 NOTE — Telephone Encounter (Signed)
 I refilled the Adderall for 3 months. Also please tell our referral person that she needs to see a different Dermatologist

## 2024-04-10 NOTE — Telephone Encounter (Signed)
 Copied from CRM [id number]. Topic: Clinical - Medication Refill >> Apr 10, 2024  9:29 AM Gennette ORN wrote: Medication: amphetamine -dextroamphetamine  (ADDERALL) 20 MG tablet  Has the patient contacted their pharmacy? Yes (Agent: If no, request that the patient contact the pharmacy for the refill. If patient does not wish to contact the pharmacy document the reason why and proceed with request.) (Agent: If yes, when and what did the pharmacy advise?)  This is the patient's preferred pharmacy:   [facility] [address] [place] - [address] [facility] [place] [address] Phone: [phone] Fax: [phone]  Is this the correct pharmacy for this prescription? Yes If no, delete pharmacy and type the correct one.   Has the prescription been filled recently? Yes  Is the patient out of the medication? Yes  Has the patient been seen for an appointment in the last year OR does the patient have an upcoming appointment? Yes  Can we respond through MyChart? Yes  Agent: Please be advised that Rx refills may take up to 3 business days. We ask that you follow-up with your pharmacy.

## 2024-04-11 ENCOUNTER — Telehealth: Payer: Self-pay | Admitting: *Deleted

## 2024-04-11 NOTE — Telephone Encounter (Signed)
 Copied from CRM [id number]. Topic: Clinical - Prescription Issue >> Apr 11, 2024  4:15 PM Drema MATSU wrote: Reason for CRM: Patient states that she wants [facility] completely off of her list.

## 2024-04-12 ENCOUNTER — Telehealth: Payer: Self-pay

## 2024-04-12 NOTE — Telephone Encounter (Signed)
 Copied from CRM [id number]. Topic: Clinical - Prescription Issue >> Apr 12, 2024  3:30 PM Rea C wrote: Reason for CRM: Patient is calling in and asking if her amphetamine -dextroamphetamine  (ADDERALL) 20 MG tablet prescription can be forwarded to [facility]. She no longer uses [facility].   [facility] [address] [facility], [place] - [address] [facility] [place] [address] Phone: [phone] Fax: [phone] Hours: Not open 24 hours    [phone] (M)

## 2024-04-15 MED ORDER — AMPHETAMINE-DEXTROAMPHETAMINE 20 MG PO TABS: 20.0000 mg | ORAL_TABLET | Freq: Every morning | ORAL | 0 refills | Status: DC

## 2024-04-15 NOTE — Telephone Encounter (Signed)
 Done

## 2024-04-15 NOTE — Addendum Note (Signed)
 Addended by: JOHNNY SENIOR A on: 04/15/2024 12:55 PM   Modules accepted: Orders

## 2024-05-07 ENCOUNTER — Telehealth: Payer: Self-pay | Admitting: Family Medicine

## 2024-05-07 NOTE — Telephone Encounter (Signed)
 Spoke with pt advised to contact her insurance provider to see which dermatology specialist is in network. Pt will call the office with an update

## 2024-05-07 NOTE — Telephone Encounter (Signed)
 Copied from CRM [id number]. Topic: Referral - Question >> May 07, 2024  3:46 PM Mia F wrote: Reason for CRM: Pt says she need a new dermatologist to schedule with as the place on the referral does not accept her insurance. She says if the office is scheduling the appt for her, please verify that they accept her insurance first. She says she need the appt asap because she believes she has another cancer spot on her back.

## 2024-06-01 ENCOUNTER — Other Ambulatory Visit: Payer: Self-pay | Admitting: Family Medicine

## 2024-07-06 ENCOUNTER — Other Ambulatory Visit: Payer: Self-pay | Admitting: Family Medicine

## 2024-07-24 ENCOUNTER — Ambulatory Visit: Payer: Self-pay
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
## 2024-07-24 NOTE — Telephone Encounter (Signed)
 Appt scheduled on 11/13 at 10:45am.

## 2024-07-25 ENCOUNTER — Ambulatory Visit (INDEPENDENT_AMBULATORY_CARE_PROVIDER_SITE_OTHER)
Admission: RE | Admit: 2024-07-25 | Discharge: 2024-07-25 | Disposition: A | Discharge status: Home / Self Care | Source: Ambulatory Visit | Attending: Family Medicine | Admitting: Family Medicine

## 2024-07-25 ENCOUNTER — Encounter: Payer: Self-pay | Admitting: Family Medicine

## 2024-07-25 ENCOUNTER — Ambulatory Visit (INDEPENDENT_AMBULATORY_CARE_PROVIDER_SITE_OTHER): Admitting: Family Medicine

## 2024-07-25 VITALS — BP 98/64 | Temp 98.7°F | Wt 128.0 lb

## 2024-07-25 DIAGNOSIS — M546 Pain in thoracic spine: Secondary | ICD-10-CM | POA: Diagnosis not present

## 2024-07-25 DIAGNOSIS — G8929 Other chronic pain: Secondary | ICD-10-CM

## 2024-07-25 DIAGNOSIS — F418 Other specified anxiety disorders: Secondary | ICD-10-CM | POA: Diagnosis not present

## 2024-07-25 MED ORDER — ESCITALOPRAM OXALATE 20 MG PO TABS: 20.0000 mg | ORAL_TABLET | Freq: Every day | ORAL | 3 refills | Status: DC

## 2024-07-25 NOTE — Progress Notes (Signed)
   Subjective:    Patient ID: [name], female    DOB: 11-Jun-1991, 33 y.o.   MRN: [id number]  HPI Here for several issues. First she has been having constant sharp pain in the center of her back between the shouder blades for several months. No recent trauma. Tylenol  and heat help a little. No pain in the arms. She was seen in the ED on 04-05-24 for low back pain, and a CT of her lumbar spine was unremarkable. Now this pain is a bit higher in her back. Second she started taking Lexapro  10 mg daily in May for anxiety and depression, and this helped for awhile. Now it seems to not work as well as it did. She reports no side effects.    Review of Systems  Constitutional: Negative.   Respiratory: Negative.    Cardiovascular: Negative.   Musculoskeletal:  Positive for back pain.  Psychiatric/Behavioral:  Positive for decreased concentration and dysphoric mood. Negative for agitation, behavioral problems, confusion, hallucinations, self-injury, sleep disturbance and suicidal ideas. The patient is nervous/anxious.        Objective:   Physical Exam Constitutional:      General: She is not in acute distress.    Appearance: Normal appearance.  Cardiovascular:     Rate and Rhythm: Normal rate and regular rhythm.     Pulses: Normal pulses.     Heart sounds: Normal heart sounds.  Pulmonary:     Effort: Pulmonary effort is normal.     Breath sounds: Normal breath sounds.  Musculoskeletal:     Comments: She is very tender over the spine between the scapulae. Extension of her spine is limited by pain, but flexion and rotation are full.   Neurological:     Mental Status: She is alert.  Psychiatric:        Mood and Affect: Mood normal.        Behavior: Behavior normal.        Thought Content: Thought content normal.           Assessment & Plan:  Thoracic back pain. We will send her Xrays of her thoracic spine. I suggested she try a muscle relaxer, but she declined because they make  her sleepy. For her anxiety and depression, we will increase the Lexapro  to 20 mg daily.  Garnette Olmsted, MD

## 2024-07-30 ENCOUNTER — Ambulatory Visit: Payer: Self-pay | Admitting: Family Medicine

## 2024-08-01 ENCOUNTER — Ambulatory Visit (INDEPENDENT_AMBULATORY_CARE_PROVIDER_SITE_OTHER): Admitting: Family Medicine

## 2024-08-01 ENCOUNTER — Encounter: Payer: Self-pay | Admitting: Family Medicine

## 2024-08-01 VITALS — BP 98/60 | HR 85 | Temp 98.2°F | Ht 66.5 in | Wt 127.4 lb

## 2024-08-01 DIAGNOSIS — E559 Vitamin D deficiency, unspecified: Secondary | ICD-10-CM | POA: Diagnosis not present

## 2024-08-01 DIAGNOSIS — Z0000 Encounter for general adult medical examination without abnormal findings: Secondary | ICD-10-CM

## 2024-08-01 DIAGNOSIS — E538 Deficiency of other specified B group vitamins: Secondary | ICD-10-CM | POA: Diagnosis not present

## 2024-08-01 LAB — CBC WITH DIFFERENTIAL/PLATELET
Basophils Absolute: 0.1 K/uL (ref 0.0–0.1)
Basophils Relative: 0.8 % (ref 0.0–3.0)
Eosinophils Absolute: 0 K/uL (ref 0.0–0.7)
Eosinophils Relative: 0.4 % (ref 0.0–5.0)
HCT: 39.9 % (ref 36.0–46.0)
Hemoglobin: 13.5 g/dL (ref 12.0–15.0)
Lymphocytes Relative: 23.4 % (ref 12.0–46.0)
Lymphs Abs: 1.7 K/uL (ref 0.7–4.0)
MCHC: 33.9 g/dL (ref 30.0–36.0)
MCV: 88.5 fl (ref 78.0–100.0)
Monocytes Absolute: 0.6 K/uL (ref 0.1–1.0)
Monocytes Relative: 8.2 % (ref 3.0–12.0)
Neutro Abs: 4.8 K/uL (ref 1.4–7.7)
Neutrophils Relative %: 67.2 % (ref 43.0–77.0)
Platelets: 332 K/uL (ref 150.0–400.0)
RBC: 4.52 Mil/uL (ref 3.87–5.11)
RDW: 13.1 % (ref 11.5–15.5)
WBC: 7.2 K/uL (ref 4.0–10.5)

## 2024-08-01 LAB — HEPATIC FUNCTION PANEL
ALT: 40 U/L — ABNORMAL HIGH (ref 0–35)
AST: 26 U/L (ref 0–37)
Albumin: 4.5 g/dL (ref 3.5–5.2)
Alkaline Phosphatase: 71 U/L (ref 39–117)
Bilirubin, Direct: 0.1 mg/dL (ref 0.0–0.3)
Total Bilirubin: 0.5 mg/dL (ref 0.2–1.2)
Total Protein: 7.3 g/dL (ref 6.0–8.3)

## 2024-08-01 LAB — LIPID PANEL
Cholesterol: 148 mg/dL (ref 0–200)
HDL: 69.9 mg/dL (ref >39.00)
LDL Cholesterol: 71 mg/dL (ref 0–99)
NonHDL: 77.9
Total CHOL/HDL Ratio: 2
Triglycerides: 37 mg/dL (ref 0.0–149.0)
VLDL: 7.4 mg/dL (ref 0.0–40.0)

## 2024-08-01 LAB — VITAMIN B12: Vitamin B-12: 425 pg/mL (ref 211–911)

## 2024-08-01 LAB — BASIC METABOLIC PANEL WITH GFR
BUN: 12 mg/dL (ref 6–23)
CO2: 30 meq/L (ref 19–32)
Calcium: 9.2 mg/dL (ref 8.4–10.5)
Chloride: 100 meq/L (ref 96–112)
Creatinine, Ser: 0.71 mg/dL (ref 0.40–1.20)
GFR: 110.43 mL/min (ref >60.00)
Glucose, Bld: 75 mg/dL (ref 70–99)
Potassium: 3.7 meq/L (ref 3.5–5.1)
Sodium: 136 meq/L (ref 135–145)

## 2024-08-01 LAB — VITAMIN D 25 HYDROXY (VIT D DEFICIENCY, FRACTURES): VITD: 37.85 ng/mL (ref 30.00–100.00)

## 2024-08-01 LAB — HEMOGLOBIN A1C: Hgb A1c MFr Bld: 5 % (ref 4.6–6.5)

## 2024-08-01 LAB — TSH: TSH: 1.49 u[IU]/mL (ref 0.35–5.50)

## 2024-08-01 NOTE — Progress Notes (Signed)
 Subjective:    Patient ID: [name], female    DOB: 12/12/1990, 33 y.o.   MRN: [id number]  HPI Here for a well exam. We have been seeing her for thoracic back pains, and she is managing these with stretching. Her main concern today is constant fatigue and slight muscle soreness over her body. She eats a healthy diet and she sleeps well. 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    Review of Systems  Constitutional:  Positive for fatigue.  HENT: Negative.    Eyes: Negative.   Respiratory: Negative.    Cardiovascular: Negative.   Gastrointestinal: Negative.   Genitourinary:  Negative for decreased urine volume, difficulty urinating, dyspareunia, dysuria, enuresis, flank pain, frequency, hematuria, pelvic pain and urgency.  Musculoskeletal: Negative.   Skin: Negative.   Neurological: Negative.  Negative for headaches.  Psychiatric/Behavioral: Negative.         Objective:   Physical Exam Constitutional:      General: She is not in acute distress.    Appearance: Normal appearance. She is well-developed.  HENT:     Head: Normocephalic and atraumatic.     Right Ear: External ear normal.     Left Ear: External ear normal.     Nose: Nose normal.     Mouth/Throat:     Pharynx: No oropharyngeal exudate.  Eyes:     General: No scleral icterus.    Conjunctiva/sclera: Conjunctivae normal.     Pupils: Pupils are equal, round, and reactive to light.  Neck:     Thyroid : No thyromegaly.     Vascular: No JVD.  Cardiovascular:     Rate and Rhythm: Normal rate and regular rhythm.     Pulses: Normal pulses.     Heart sounds: Normal heart sounds. No murmur heard.    No friction rub. No gallop.  Pulmonary:     Effort: Pulmonary effort is normal. No respiratory distress.     Breath sounds: Normal breath sounds. No wheezing or rales.  Chest:     Chest wall: No tenderness.  Abdominal:     General: Bowel sounds are normal. There is no distension.      Palpations: Abdomen is soft. There is no mass.     Tenderness: There is no abdominal tenderness. There is no guarding or rebound.  Musculoskeletal:        General: No tenderness. Normal range of motion.     Cervical back: Normal range of motion and neck supple.  Lymphadenopathy:     Cervical: No cervical adenopathy.  Skin:    General: Skin is warm and dry.     Findings: No erythema or rash.  Neurological:     General: No focal deficit present.     Mental Status: She is alert and oriented to person, place, and time.     Cranial Nerves: No cranial nerve deficit.     Motor: No abnormal muscle tone.     Coordination: Coordination normal.     Deep Tendon Reflexes: Reflexes are normal and symmetric. Reflexes normal.  Psychiatric:        Mood and Affect: Mood normal.        Behavior: Behavior normal.        Thought Content: Thought content normal.        Judgment: Judgment normal.           Assessment & Plan:  Well exam. We discussed diet and exercise. Get fasting labs. Garnette Olmsted, MD

## 2024-08-02 ENCOUNTER — Ambulatory Visit: Payer: Self-pay | Admitting: Family Medicine

## 2024-08-12 ENCOUNTER — Ambulatory Visit: Admitting: Family Medicine

## 2024-08-15 ENCOUNTER — Other Ambulatory Visit: Payer: Self-pay | Admitting: Family Medicine

## 2024-08-15 NOTE — Telephone Encounter (Signed)
 Copied from CRM #[id number]. Topic: Clinical - Medication Refill >> Aug 15, 2024  8:44 AM Gustabo D wrote: Medication: amphetamine -dextroamphetamine  (ADDERALL) 20 MG tablet  Has the patient contacted their pharmacy? Yes (Agent: If no, request that the patient contact the pharmacy for the refill. If patient does not wish to contact the pharmacy document the reason why and proceed with request.) (Agent: If yes, when and what did the pharmacy advise?)  This is the patient's preferred pharmacy:  [facility] [address] [place] - [address] [facility] [place] [address] Phone: [phone] Fax: [phone]  Is this the correct pharmacy for this prescription? Yes If no, delete pharmacy and type the correct one.   Has the prescription been filled recently? No  Is the patient out of the medication? Yes  Has the patient been seen for an appointment in the last year OR does the patient have an upcoming appointment? Yes  Can we respond through MyChart? Yes  Agent: Please be advised that Rx refills may take up to 3 business days. We ask that you follow-up with your pharmacy.

## 2024-08-16 ENCOUNTER — Ambulatory Visit: Admitting: Family Medicine

## 2024-08-16 MED ORDER — AMPHETAMINE-DEXTROAMPHETAMINE 20 MG PO TABS: 20.0000 mg | ORAL_TABLET | Freq: Every morning | ORAL | 0 refills | Status: AC

## 2024-08-16 NOTE — Telephone Encounter (Signed)
 Done

## 2024-08-21 ENCOUNTER — Other Ambulatory Visit: Payer: Self-pay | Admitting: Family Medicine

## 2024-08-21 MED ORDER — ESCITALOPRAM OXALATE 20 MG PO TABS: 20.0000 mg | ORAL_TABLET | Freq: Every day | ORAL | 3 refills | Status: AC

## 2024-08-21 NOTE — Telephone Encounter (Signed)
 Copied from CRM #[id number]. Topic: Clinical - Medication Refill >> Aug 21, 2024  9:30 AM Tiffini S wrote: Medication: escitalopram  (LEXAPRO ) 20 MG tablet- loss medication per [facility]ce with [facility]    Has the patient contacted their pharmacy? Yes (Agent: If no, request that the patient contact the pharmacy for the refill. If patient does not wish to contact the pharmacy document the reason why and proceed with request.) (Agent: If yes, when and what did the pharmacy advise?)  This is the patient's preferred pharmacy:  [facility] [address] [place] - [address] [facility] [place] [address] Phone: [phone] Fax: [phone]  Is this the correct pharmacy for this prescription? Yes If no, delete pharmacy and type the correct one.   Has the prescription been filled recently? Yes  Is the patient out of the medication? Yes- patient was given three tablets by pharmacy for a hold over   Has the patient been seen for an appointment in the last year OR does the patient have an upcoming appointment? Yes  Can we respond through MyChart? N/A   Agent: Please be advised that Rx refills may take up to 3 business days. We ask that you follow-up with your pharmacy.

## 2024-09-30 ENCOUNTER — Telehealth: Payer: Self-pay

## 2024-09-30 NOTE — Telephone Encounter (Signed)
 Copied from CRM #[id number]. Topic: Clinical - Medication Refill >> Sep 30, 2024  1:35 PM Eva FALCON wrote: Medication: amphetamine -dextroamphetamine  (ADDERALL) 20 MG tablet  Has the patient contacted their pharmacy? Yes, needs a new script  (Agent: If no, request that the patient contact the pharmacy for the refill. If patient does not wish to contact the pharmacy document the reason why and proceed with request.) (Agent: If yes, when and what did the pharmacy advise?)  This is the patient's preferred pharmacy:  [facility] [address] [place] - [address] [facility] [place] [address] Phone: [phone] Fax: [phone]  Is this the correct pharmacy for this prescription? Yes If no, delete pharmacy and type the correct one.   Has the prescription been filled recently? Yes  Is the patient out of the medication? Yes  Has the patient been seen for an appointment in the last year OR does the patient have an upcoming appointment? Yes  Can we respond through MyChart? Yes  Agent: Please be advised that Rx refills may take up to 3 business days. We ask that you follow-up with your pharmacy.

## 2024-10-01 ENCOUNTER — Ambulatory Visit: Payer: Self-pay

## 2024-10-01 DIAGNOSIS — Z77011 Contact with and (suspected) exposure to lead: Secondary | ICD-10-CM

## 2024-10-01 NOTE — Telephone Encounter (Signed)
 FYI Only or Action Required?: Action required by provider: request for appointment.  Patient was last seen in primary care on 08/01/2024 by Hannah Garnette LABOR, MD.  Called Nurse Triage reporting Nausea.  Symptoms began several months ago.  Interventions attempted: Nothing.  Symptoms are: gradually worsening.  Triage Disposition: Call PCP Now  Patient/caregiver understands and will follow disposition?: Yes       Message from [facility] sent at 10/01/2024  1:47 PM EST  Reason for Triage: Patient just found out water tested positive for led at her house states she's showing symptoms and getting really bad. Unexplained nausea, headaches, fatigue, joints and muscles have been hurting brain fog. Looking to come in for blood testing and whatever else she needs to do for next steps      Reason for Disposition  [1] Follow-up call from patient regarding patient's clinical status AND [2] information urgent  Answer Assessment - Initial Assessment Questions 1. REASON FOR CALL or QUESTION: What is your reason for calling today? or How can I best     Pt contacted clinic requesting labs for lead. Pt very upset and in disbelief. Pt states her and her children have been very sick over the past couple months with nausea, h/a, brain fog. States that their home has been tested for contamination and other possible exposure. Pt states that yesterday, she received results that her well water had moderate lead exposure in the water. Pt states she has contacted [facility] to have her and children tested for lead but UC stated they did not draw those labs and she would need to f/u with PCP. She stated that her children's pediatrician has stepped back from PCP care and her and Dr. Johnny have discussed her children transferring to his care. No appts scheduled at this time as she is requesting labs. Contacted [facility] and spoke to [place] who stated CRM would need to be sent with all the patients information, okay to send in one  CRM per [facility]. Pt would like f/u call today if labs would be possible in clinic.   02/15/2009 [name]  08/14/2015 [name]  04/01/2011 [name]  Protocols used: PCP Call - No Triage-A-AH

## 2024-10-01 NOTE — Telephone Encounter (Signed)
 She already has refills until March 5

## 2024-10-02 NOTE — Telephone Encounter (Signed)
 Patient informed of the message below and a lab appt was scheduled on 1/22.

## 2024-10-02 NOTE — Telephone Encounter (Signed)
 No need for an OV. I put in an order for a blood lead level, so she can make a lab appt.

## 2024-10-02 NOTE — Telephone Encounter (Signed)
 Pt called the office states that she and her kids have been exposed to lead from her house water. Pt wants to know if she can get blood work or she needs to schedule office appointment. Please advise

## 2024-10-03 ENCOUNTER — Other Ambulatory Visit (INDEPENDENT_AMBULATORY_CARE_PROVIDER_SITE_OTHER)

## 2024-10-03 ENCOUNTER — Other Ambulatory Visit

## 2024-10-03 DIAGNOSIS — Z77011 Contact with and (suspected) exposure to lead: Secondary | ICD-10-CM | POA: Diagnosis not present

## 2024-10-07 ENCOUNTER — Encounter: Admitting: Obstetrics & Gynecology

## 2024-10-07 LAB — LEAD, BLOOD (ADULT >= 16 YRS): Lead: 2.1 ug/dL

## 2024-10-08 ENCOUNTER — Ambulatory Visit: Payer: Self-pay | Admitting: Family Medicine

## 2024-10-08 MED ORDER — AMPHETAMINE-DEXTROAMPHETAMINE 15 MG PO TABS: ORAL_TABLET | ORAL | 0 refills | Status: AC

## 2024-10-08 NOTE — Telephone Encounter (Signed)
 Spoke with pt pharmacy stated that pt has 3 refills at the pharmacy, advised to get the refill ready for pt, pt notified to pick up Rx. Pt wants to let Dr Johnny know that she feels like after the increase of the Lexapro  to 20 mg her Adderall is not as effective as before, she wants to know if she can have a lower dose of Adderall 15 mg to take at noon to help her get through the day. Pt wants to stay on the Adderall 20 mg and the Lexapro  20 mg. Please advise

## 2024-10-08 NOTE — Addendum Note (Signed)
 Addended by: JOHNNY SENIOR A on: 10/08/2024 12:56 PM   Modules accepted: Orders

## 2024-10-08 NOTE — Telephone Encounter (Signed)
 I sent in 30 tablets of Adderall 15 mg to try in the afternoons

## 2024-10-31 ENCOUNTER — Encounter: Admitting: Obstetrics and Gynecology
